# Patient Record
Sex: Female | Born: 1950 | Race: Black or African American | Hispanic: No | Smoking: Former smoker
Health system: Southern US, Community
[De-identification: ages and names within clinical notes are randomized; demographics above are authoritative.]

## PROBLEM LIST (undated history)

## (undated) DIAGNOSIS — E119 Type 2 diabetes mellitus without complications: Secondary | ICD-10-CM

## (undated) DIAGNOSIS — I10 Essential (primary) hypertension: Secondary | ICD-10-CM

## (undated) DIAGNOSIS — E785 Hyperlipidemia, unspecified: Secondary | ICD-10-CM

## (undated) HISTORY — PX: COLONOSCOPY: SHX174

## (undated) HISTORY — DX: Type 2 diabetes mellitus without complications: E11.9

## (undated) HISTORY — DX: Essential (primary) hypertension: I10

## (undated) HISTORY — DX: Hyperlipidemia, unspecified: E78.5

---

## 1999-11-10 HISTORY — PX: ABDOMINAL HYSTERECTOMY: SHX81

## 2005-11-30 ENCOUNTER — Ambulatory Visit (HOSPITAL_COMMUNITY): Admission: RE | Admit: 2005-11-30 | Discharge: 2005-11-30 | Payer: Self-pay | Admitting: Internal Medicine

## 2005-11-30 ENCOUNTER — Ambulatory Visit: Payer: Self-pay | Admitting: Internal Medicine

## 2006-03-01 ENCOUNTER — Ambulatory Visit (HOSPITAL_COMMUNITY): Admission: RE | Admit: 2006-03-01 | Discharge: 2006-03-01 | Payer: Self-pay | Admitting: Family Medicine

## 2006-11-09 DIAGNOSIS — Z9289 Personal history of other medical treatment: Secondary | ICD-10-CM

## 2007-03-07 ENCOUNTER — Ambulatory Visit (HOSPITAL_COMMUNITY): Admission: RE | Admit: 2007-03-07 | Discharge: 2007-03-07 | Payer: Self-pay | Admitting: Family Medicine

## 2008-03-09 ENCOUNTER — Ambulatory Visit (HOSPITAL_COMMUNITY): Admission: RE | Admit: 2008-03-09 | Discharge: 2008-03-09 | Payer: Self-pay | Admitting: Family Medicine

## 2008-07-26 ENCOUNTER — Ambulatory Visit (HOSPITAL_COMMUNITY): Admission: RE | Admit: 2008-07-26 | Discharge: 2008-07-26 | Payer: Self-pay | Admitting: Family Medicine

## 2009-04-03 ENCOUNTER — Ambulatory Visit (HOSPITAL_COMMUNITY): Admission: RE | Admit: 2009-04-03 | Discharge: 2009-04-03 | Payer: Self-pay | Admitting: Family Medicine

## 2010-04-17 ENCOUNTER — Ambulatory Visit (HOSPITAL_COMMUNITY): Admission: RE | Admit: 2010-04-17 | Discharge: 2010-04-17 | Payer: Self-pay | Admitting: Cardiology

## 2011-03-05 ENCOUNTER — Ambulatory Visit (INDEPENDENT_AMBULATORY_CARE_PROVIDER_SITE_OTHER): Payer: Managed Care, Other (non HMO) | Admitting: Otolaryngology

## 2011-03-05 DIAGNOSIS — R490 Dysphonia: Secondary | ICD-10-CM

## 2011-03-05 DIAGNOSIS — J310 Chronic rhinitis: Secondary | ICD-10-CM

## 2011-03-27 NOTE — Op Note (Signed)
NAME:  Megan Wyatt, Megan Wyatt                 ACCOUNT NO.:  [id number]   MEDICAL RECORD NO.:  [id number]          PATIENT TYPE:  AMB   LOCATION:  DAY                           FACILITY:  [facility]   PHYSICIAN:  R. Roetta Sessions, M.D. DATE OF BIRTH:  [birth year]   DATE OF PROCEDURE:  11/30/2005  DATE OF DISCHARGE:                                 OPERATIVE REPORT   PROCEDURE:  Screening colonoscopy.   ENDOSCOPIST:  Gerrit Friends. Rourk, M.D.   INDICATIONS FOR PROCEDURE:  Megan Wyatt is a pleasant, 60 year old  African-American female who was sent down out the courtesy of Dr. Merleen Milliner  in [place], [place] for colorectal cancer screening.  She is devoid of any  lower GI tract symptoms.  There is no family history of colorectal  neoplasia.  She has never had a colonoscopy or other imaging previously.  Colonoscopy is now being done as a screening maneuver.  This approach has  been discussed with the patient at length.  The potential risks, benefits,  and alternatives have been reviewed; questions answered.  Please see the  documentation in the medical record.   PROCEDURE NOTE:  O2 saturation, blood pressure, pulse and respirations were  monitored throughout the entire procedure.   CONSCIOUS SEDATION:  Versed 4 mg IV, Demerol 75 mg IV in divided doses.   INSTRUMENT:  Olympus video chip system.   FINDINGS:  A digital rectal exam revealed no abnormalities.   ENDOSCOPIC FINDINGS:  The prep was excellent.   RECTUM:  Examination of the rectal mucosa including the retroflex view of  the anal verge revealed no abnormalities.   COLON:  The colonic mucosa was surveyed from the rectosigmoid junction  through the left transverse and right colon to the area of the appendiceal  orifice, ileocecal valve, and cecum.  These structures were well seen and  photographed for the record.   From this level the scope was slowly withdrawn.  All previously mentioned  mucosal surfaces were again seen.  The colonic mucosa  appeared normal.  The  patient tolerated the procedure well and was reacted in endoscopy.   IMPRESSION:  1.  Normal rectum.  2.  Normal colon.   RECOMMENDATIONS:  Repeat colonoscopy in 10 years.      Jonathon Bellows, M.D.  Electronically Signed     RMR/MEDQ  D:  11/30/2005  T:  11/30/2005  Job:  [id number]   cc:   Leane Call, M.D.  [place]  [place]

## 2011-04-09 ENCOUNTER — Other Ambulatory Visit (HOSPITAL_COMMUNITY): Payer: Self-pay | Admitting: "Endocrinology

## 2011-04-09 DIAGNOSIS — Z139 Encounter for screening, unspecified: Secondary | ICD-10-CM

## 2011-04-21 ENCOUNTER — Ambulatory Visit (HOSPITAL_COMMUNITY): Payer: Managed Care, Other (non HMO)

## 2011-04-24 ENCOUNTER — Ambulatory Visit (HOSPITAL_COMMUNITY)
Admission: RE | Admit: 2011-04-24 | Discharge: 2011-04-24 | Disposition: A | Discharge status: Home / Self Care | Payer: 59 | Source: Ambulatory Visit | Attending: "Endocrinology | Admitting: "Endocrinology

## 2011-04-24 DIAGNOSIS — Z1231 Encounter for screening mammogram for malignant neoplasm of breast: Secondary | ICD-10-CM | POA: Insufficient documentation

## 2011-04-24 DIAGNOSIS — Z139 Encounter for screening, unspecified: Secondary | ICD-10-CM

## 2011-07-03 ENCOUNTER — Encounter: Payer: Self-pay | Admitting: Emergency Medicine

## 2011-07-03 ENCOUNTER — Emergency Department (HOSPITAL_COMMUNITY)
Admission: EM | Admit: 2011-07-03 | Discharge: 2011-07-03 | Disposition: A | Discharge status: Home / Self Care | Payer: Managed Care, Other (non HMO) | Attending: Emergency Medicine | Admitting: Emergency Medicine

## 2011-07-03 DIAGNOSIS — M7150 Other bursitis, not elsewhere classified, unspecified site: Secondary | ICD-10-CM

## 2011-07-03 DIAGNOSIS — M25521 Pain in right elbow: Secondary | ICD-10-CM

## 2011-07-03 DIAGNOSIS — M25529 Pain in unspecified elbow: Secondary | ICD-10-CM | POA: Insufficient documentation

## 2011-07-03 DIAGNOSIS — I10 Essential (primary) hypertension: Secondary | ICD-10-CM | POA: Insufficient documentation

## 2011-07-03 DIAGNOSIS — M25519 Pain in unspecified shoulder: Secondary | ICD-10-CM | POA: Insufficient documentation

## 2011-07-03 DIAGNOSIS — E119 Type 2 diabetes mellitus without complications: Secondary | ICD-10-CM | POA: Insufficient documentation

## 2011-07-03 DIAGNOSIS — E7800 Pure hypercholesterolemia, unspecified: Secondary | ICD-10-CM | POA: Insufficient documentation

## 2011-07-03 HISTORY — DX: Essential (primary) hypertension: I10

## 2011-07-03 MED ORDER — HYDROCODONE-ACETAMINOPHEN 5-325 MG PO TABS: 2.0000 | ORAL_TABLET | ORAL | Status: AC | PRN

## 2011-07-03 MED ORDER — PREDNISONE 10 MG PO TABS: 20.0000 mg | ORAL_TABLET | Freq: Every day | ORAL | Status: AC

## 2011-07-03 MED ORDER — HYDROCODONE-ACETAMINOPHEN 5-325 MG PO TABS
2.0000 | ORAL_TABLET | Freq: Once | ORAL | Status: AC
  Administered 2011-07-03: 2 via ORAL
  Filled 2011-07-03: qty 2

## 2011-07-03 MED ORDER — PREDNISONE 20 MG PO TABS
60.0000 mg | ORAL_TABLET | Freq: Once | ORAL | Status: AC
  Administered 2011-07-03: 60 mg via ORAL
  Filled 2011-07-03: qty 1

## 2011-07-03 NOTE — ED Notes (Signed)
Patient c/o right arm pain x 3 days.

## 2011-07-03 NOTE — ED Provider Notes (Signed)
History     CSN: [id number] Arrival date & time: 07/03/2011  1:32 AM  Chief Complaint  Patient presents with  . Arm Pain   HPI Comments: Seen 0133. Patient is formerly a resident of [place] who moved to the area several years ago and established with doctors. She is a diabetic and has at least once a year flair of bursitis to right shoulder accompanied by an elbow tendinitis. It results in pain from the elbow to the shoulder and is associated with swelling of the hand. She has been seen in the past by Dr. Merleen Milliner, orthopedist in [place] and given cortisone injections. She does not have an orthopedist here. She denies fever, chills, numbness, tingling. It is painful to attempt to raise the shoulder or bend the elbow. She has been using tramadol with no effect. She has used ice with no relief.PMD is Dr. Fransico Him.  Patient is a 60 y.o. female presenting with arm pain. The history is provided by the patient.  Arm Pain This is a new (Patient with h/o bursitis, tendinitis, arthritis here with pain to elbow, upper arm and shoulder.) problem. The current episode started more than 2 days ago. The problem occurs constantly. The problem has not changed since onset.Pertinent negatives include no chest pain, no abdominal pain, no headaches and no shortness of breath. Exacerbated by: movement. The symptoms are relieved by nothing. Treatments tried: nsaids. The treatment provided no relief.    Past Medical History  Diagnosis Date  . Diabetes mellitus   . Hypertension   . High cholesterol     Past Surgical History  Procedure Date  . Abdominal hysterectomy     No family history on file.  History  Substance Use Topics  . Smoking status: Never Smoker   . Smokeless tobacco: Not on file  . Alcohol Use: No    OB History    Grav Para Term Preterm Abortions TAB SAB Ect Mult Living                  Review of Systems  Respiratory: Negative for shortness of breath.   Cardiovascular: Negative for  chest pain.  Gastrointestinal: Negative for abdominal pain.  Musculoskeletal:       Right shoulder, elbow pain. Swelling of right hand.  Neurological: Negative for headaches.  All other systems reviewed and are negative.    Physical Exam  BP 139/73  Pulse 82  Temp(Src) 98.4 F (36.9 C) (Oral)  Resp 18  Ht 5\' 2"  (1.575 m)  Wt 178 lb (80.74 kg)  BMI 32.56 kg/m2  SpO2 99%  Physical Exam  Nursing note and vitals reviewed. Constitutional: She appears well-developed and well-nourished. She appears distressed.  HENT:  Head: Normocephalic and atraumatic.  Eyes: EOM are normal.  Neck: Normal range of motion. Neck supple.  Cardiovascular: Normal rate, normal heart sounds and intact distal pulses.   Pulmonary/Chest: Effort normal and breath sounds normal.  Musculoskeletal: She exhibits edema and tenderness.       Right Elbow ROM Decreased Unable to fully extend / Unable to flex to 90 degrees, Supracondylar region NT / tender, Radial head NT / tender, Olecrenon process NT / tender, Medial epicondyle NT / tender, Lateral epicondyle  tender, affected extremity Shoulder tender Wrist NT, Hand NT with soft tissue swelling to dorsum of hand with distal NVI CR<2secs, radial pulse intact, Sensation LT and Motor intact distally in distribution of radial, median, and ulnar nerve function.  Affected shoulder with ROM  somewhat limited  Drop test  painful but negativel, , clavicle NT / tender, A/C joint NT / , scapula NT / , proximal humerus NT, shoulder joint  tender, Motor strength  decreased at shoulder due to pain, Sensation intact LT over deltoid region.  Neurological: She has normal reflexes.  Skin: Skin is warm and dry.    ED Course  Procedures  Patient with pain to right elbow and shoulder c/w tendinitis and bursitis. Patient has a h/o same. Given analgesics, prednisone. Referral to orthopedist for follow up.Patient  understands and agrees with initial ED impression and plan with expectations  set for ED visit. Reviewed nurse notes and vital signs.     Nicoletta Dress. Colon Branch, MD 07/03/11 1610

## 2012-04-08 ENCOUNTER — Other Ambulatory Visit (HOSPITAL_COMMUNITY): Payer: Self-pay | Admitting: Family Medicine

## 2012-04-08 DIAGNOSIS — Z139 Encounter for screening, unspecified: Secondary | ICD-10-CM

## 2012-04-26 ENCOUNTER — Ambulatory Visit (HOSPITAL_COMMUNITY)
Admission: RE | Admit: 2012-04-26 | Discharge: 2012-04-26 | Disposition: A | Discharge status: Home / Self Care | Payer: Self-pay | Source: Ambulatory Visit | Attending: Family Medicine | Admitting: Family Medicine

## 2012-04-26 DIAGNOSIS — Z139 Encounter for screening, unspecified: Secondary | ICD-10-CM

## 2012-04-26 DIAGNOSIS — Z1231 Encounter for screening mammogram for malignant neoplasm of breast: Secondary | ICD-10-CM | POA: Insufficient documentation

## 2012-05-02 ENCOUNTER — Other Ambulatory Visit: Payer: Self-pay | Admitting: Family Medicine

## 2012-05-02 DIAGNOSIS — R928 Other abnormal and inconclusive findings on diagnostic imaging of breast: Secondary | ICD-10-CM

## 2012-05-18 ENCOUNTER — Ambulatory Visit (HOSPITAL_COMMUNITY)
Admission: RE | Admit: 2012-05-18 | Discharge: 2012-05-18 | Disposition: A | Discharge status: Home / Self Care | Payer: Managed Care, Other (non HMO) | Source: Ambulatory Visit | Attending: Family Medicine | Admitting: Family Medicine

## 2012-05-18 DIAGNOSIS — R928 Other abnormal and inconclusive findings on diagnostic imaging of breast: Secondary | ICD-10-CM | POA: Insufficient documentation

## 2013-08-25 ENCOUNTER — Other Ambulatory Visit: Payer: Self-pay | Admitting: Internal Medicine

## 2013-08-25 NOTE — Telephone Encounter (Signed)
LMTCB to clarify dose 

## 2013-08-29 NOTE — Telephone Encounter (Signed)
Rx was sent to pharmacy electronically. 

## 2013-09-20 ENCOUNTER — Encounter: Payer: Self-pay | Admitting: *Deleted

## 2013-09-21 ENCOUNTER — Encounter: Payer: Self-pay | Admitting: Internal Medicine

## 2013-09-21 ENCOUNTER — Ambulatory Visit: Payer: Managed Care, Other (non HMO) | Admitting: Internal Medicine

## 2013-10-03 ENCOUNTER — Other Ambulatory Visit: Payer: Self-pay | Admitting: Internal Medicine

## 2013-10-03 NOTE — Telephone Encounter (Signed)
Rx was sent to pharmacy electronically. 

## 2015-08-06 ENCOUNTER — Telehealth: Payer: Self-pay | Admitting: "Endocrinology

## 2015-08-06 NOTE — Telephone Encounter (Signed)
Left message for pt to call back. She has not seen Dr Fransico Him since 2013.

## 2015-08-06 NOTE — Telephone Encounter (Signed)
Allegra just wants you to give her a call

## 2015-09-09 ENCOUNTER — Ambulatory Visit (INDEPENDENT_AMBULATORY_CARE_PROVIDER_SITE_OTHER): Payer: PRIVATE HEALTH INSURANCE | Admitting: "Endocrinology

## 2015-09-09 ENCOUNTER — Encounter: Payer: Self-pay | Admitting: "Endocrinology

## 2015-09-09 VITALS — BP 111/71 | HR 73 | Ht 62.0 in | Wt 136.2 lb

## 2015-09-09 DIAGNOSIS — I10 Essential (primary) hypertension: Secondary | ICD-10-CM | POA: Diagnosis not present

## 2015-09-09 DIAGNOSIS — E785 Hyperlipidemia, unspecified: Secondary | ICD-10-CM | POA: Diagnosis not present

## 2015-09-09 DIAGNOSIS — E119 Type 2 diabetes mellitus without complications: Secondary | ICD-10-CM

## 2015-09-09 MED ORDER — METFORMIN HCL 1000 MG PO TABS: 500.0000 mg | ORAL_TABLET | Freq: Two times a day (BID) | ORAL | Status: AC

## 2015-09-09 NOTE — Progress Notes (Signed)
Subjective:    Patient ID: Megan Wyatt, female    DOB: 06/01/1951,    Past Medical History  Diagnosis Date  . Diabetes mellitus   . Hypertension   . Dyslipidemia   . Family history of heart disease   . History of nuclear stress test 2008    negative bruce myoview   Past Surgical History  Procedure Laterality Date  . Abdominal hysterectomy  2001  . Transthoracic echocardiogram  2008    EF=>55%; mild MR & TR   Social History   Social History  . Marital Status: Widowed    Spouse Name: N/A  . Number of Children: N/A  . Years of Education: N/A   Social History Main Topics  . Smoking status: Never Smoker   . Smokeless tobacco: Never Used  . Alcohol Use: No  . Drug Use: No  . Sexual Activity: Not Asked   Other Topics Concern  . None   Social History Narrative   Outpatient Encounter Prescriptions as of 09/09/2015  Medication Sig  . atenolol-chlorthalidone (TENORETIC) 50-25 MG per tablet Take 1 tablet by mouth daily.    . Cholecalciferol (VITAMIN D PO) Take by mouth daily.  Marland Kitchen lisinopril (PRINIVIL,ZESTRIL) 10 MG tablet Take 10 mg by mouth daily.    . metFORMIN (GLUCOPHAGE) 1000 MG tablet Take 0.5 tablets (500 mg total) by mouth 2 (two) times daily with a meal.  . niacin 100 MG tablet Take 100 mg by mouth at bedtime.  Marland Kitchen POTASSIUM GLUCONATE PO Take by mouth daily.  Marland Kitchen pyridOXINE (VITAMIN B-6) 100 MG tablet Take 100 mg by mouth daily.  . simvastatin (ZOCOR) 20 MG tablet TAKE ONE TABLET BY MOUTH AT BEDTIME -  APPOINTMENT  NEEDED  FOR  FUTURE  REFILLS  . traMADol (ULTRAM) 50 MG tablet Take 50 mg by mouth 3 (three) times daily.    . vitamin B-12 (CYANOCOBALAMIN) 1000 MCG tablet Take 1,000 mcg by mouth daily.  . [DISCONTINUED] metFORMIN (GLUCOPHAGE) 1000 MG tablet Take 1,000 mg by mouth 2 (two) times daily with a meal.  . omeprazole (PRILOSEC) 20 MG capsule Take 20 mg by mouth daily.   No facility-administered encounter medications on file as of 09/09/2015.    ALLERGIES: Allergies  Allergen Reactions  . Novocain [Procaine]    VACCINATION STATUS:  There is no immunization history on file for this patient.  Diabetes She presents for her initial diabetic visit. She has type 2 diabetes mellitus. Onset time: She was diagnosed at approximate age of 64 years. Her disease course has been improving. There are no hypoglycemic associated symptoms. Pertinent negatives for hypoglycemia include no confusion, headaches, pallor or seizures. There are no diabetic associated symptoms. Pertinent negatives for diabetes include no chest pain, no fatigue, no polydipsia, no polyphagia and no polyuria. There are no hypoglycemic complications. Symptoms are improving. There are no diabetic complications. Risk factors for coronary artery disease include diabetes mellitus and hypertension. Current diabetic treatment includes oral agent (monotherapy). She is compliant with treatment most of the time. Her weight is decreasing steadily. She is following a generally healthy diet. She has not had a previous visit with a dietitian. She rarely (She is limited due to sciatica.) participates in exercise.  Hyperlipidemia This is a chronic problem. The current episode started more than 1 year ago. Pertinent negatives include no chest pain, myalgias or shortness of breath. Current antihyperlipidemic treatment includes statins. Risk factors for coronary artery disease include hypertension.  Hypertension This is a chronic problem.  The current episode started more than 1 year ago. Pertinent negatives include no chest pain, headaches, palpitations or shortness of breath. Past treatments include ACE inhibitors.       Review of Systems  Constitutional: Negative for fatigue and unexpected weight change.  HENT: Negative for trouble swallowing and voice change.   Eyes: Negative for visual disturbance.  Respiratory: Negative for cough, shortness of breath and wheezing.   Cardiovascular:  Negative for chest pain, palpitations and leg swelling.  Gastrointestinal: Negative for nausea, vomiting and diarrhea.  Endocrine: Negative for cold intolerance, heat intolerance, polydipsia, polyphagia and polyuria.  Musculoskeletal: Negative for myalgias and arthralgias.  Skin: Negative for color change, pallor, rash and wound.  Neurological: Negative for seizures and headaches.  Psychiatric/Behavioral: Negative for suicidal ideas and confusion.    Objective:    BP 111/71 mmHg  Pulse 73  Ht  (1.575 m)  Wt 136 lb 3.2 oz (61.78 kg)  BMI 24.91 kg/m2  SpO2 99%  Wt Readings from Last 3 Encounters:  09/09/15 136 lb 3.2 oz (61.78 kg)  07/03/11 178 lb (80.74 kg)    Physical Exam  Constitutional: She is oriented to person, place, and time. She appears well-developed.  HENT:  Head: Normocephalic and atraumatic.  Eyes: EOM are normal.  Neck: Normal range of motion. Neck supple. No tracheal deviation present. No thyromegaly present.  Cardiovascular: Normal rate and regular rhythm.   Pulmonary/Chest: Effort normal and breath sounds normal.  Abdominal: Soft. Bowel sounds are normal. There is no tenderness. There is no guarding.  Musculoskeletal: Normal range of motion. She exhibits no edema.  Neurological: She is alert and oriented to person, place, and time. She has normal reflexes. No cranial nerve deficit. Coordination normal.  Skin: Skin is warm and dry. No rash noted. No erythema. No pallor.  Psychiatric: She has a normal mood and affect. Judgment normal.        Assessment & Plan:   1. Diabetes mellitus without complication (HCC)   - Patient has currently controlled matic type 2 DM since  approximately 64 years of age,  with most recent A1c of 5.9 %. Recent labs reviewed.  -complications of diabetes which include CAD, CVA, CKD, retinopathy, and neuropathy, were  all discussed in detail with the patient.  - I have counseled the patient on diet management and weight loss,  by adopting a carbohydrate restricted/protein rich diet.  - Suggestion is made for patient to avoid simple carbohydrates   from their diet including Cakes , Desserts, Ice Cream,  Soda (  diet and regular) , Sweet Tea , Candies,  Chips, Cookies, Artificial Sweeteners,   and "Sugar-free" Products . This will help patient to have stable blood glucose profile and potentially avoid unintended weight gain.  - I encouraged the patient to switch to  unprocessed or minimally processed complex starch and increased protein intake (animal or plant source), fruits, and vegetables.  - Patient is advised to stick to a routine mealtimes to eat 3 meals  a day and avoid unnecessary snacks ( to snack only to correct hypoglycemia).  - I have approached patient with the following individualized plan to manage diabetes and patient agrees:  -I will lower her metformin to 500 mg by mouth twice a day. She did have significant weight loss in recent years, all intentional, hence she will not need aggressive medication at this point.  - Patient specific target  A1c;  LDL, HDL, Triglycerides, and  Waist Circumference were discussed in detail.  2) BP/HTN: Controlled. Continue current medications . 3) Lipids/HPL:   continue statins. 4)  Weight/Diet: Lost approximately 30 pounds over 3 years. Type, exercise, and detailed carbohydrates information provided.  5) Chronic Care/Health Maintenance:  -Patient is on Statin medications and encouraged to continue to follow up with Ophthalmology, Podiatrist at least yearly or according to recommendations, and advised to   stay away from smoking. I have recommended yearly flu vaccine and pneumonia vaccination at least every 5 years; moderate intensity exercise for up to 150 minutes weekly; and  sleep for at least 7 hours a day.   Patient to bring meter and  blood glucose logs during their next visit.   I advised patient to maintain close follow up with their PCP for primary care  needs. Follow up plan: Return in about 1 year (around 09/08/2016) for diabetes, high cholesterol, high blood pressure.  Marquis LunchGebre Nida, MD Phone: [phone]  Fax: [phone]   09/09/2015, 1:28 PM

## 2015-10-29 ENCOUNTER — Telehealth: Payer: Self-pay | Admitting: Internal Medicine

## 2015-10-29 NOTE — Telephone Encounter (Signed)
RECALL FOR TCS °

## 2015-10-29 NOTE — Telephone Encounter (Signed)
Letter in the mail 

## 2016-09-09 ENCOUNTER — Ambulatory Visit: Payer: PRIVATE HEALTH INSURANCE | Admitting: "Endocrinology

## 2017-06-05 ENCOUNTER — Encounter (HOSPITAL_COMMUNITY): Payer: Self-pay | Admitting: *Deleted

## 2017-06-05 ENCOUNTER — Emergency Department (HOSPITAL_COMMUNITY)
Admission: EM | Admit: 2017-06-05 | Discharge: 2017-06-06 | Disposition: A | Discharge status: Home / Self Care | Payer: PRIVATE HEALTH INSURANCE | Attending: Emergency Medicine | Admitting: Emergency Medicine

## 2017-06-05 DIAGNOSIS — R42 Dizziness and giddiness: Secondary | ICD-10-CM | POA: Insufficient documentation

## 2017-06-05 DIAGNOSIS — E119 Type 2 diabetes mellitus without complications: Secondary | ICD-10-CM | POA: Diagnosis not present

## 2017-06-05 DIAGNOSIS — I10 Essential (primary) hypertension: Secondary | ICD-10-CM | POA: Insufficient documentation

## 2017-06-05 DIAGNOSIS — Z79899 Other long term (current) drug therapy: Secondary | ICD-10-CM | POA: Diagnosis not present

## 2017-06-05 DIAGNOSIS — Z7984 Long term (current) use of oral hypoglycemic drugs: Secondary | ICD-10-CM | POA: Insufficient documentation

## 2017-06-05 DIAGNOSIS — Z87891 Personal history of nicotine dependence: Secondary | ICD-10-CM | POA: Diagnosis not present

## 2017-06-05 LAB — CBC WITH DIFFERENTIAL/PLATELET
BASOS ABS: 0 10*3/uL (ref 0.0–0.1)
BASOS PCT: 0 %
EOS PCT: 1 %
Eosinophils Absolute: 0 10*3/uL (ref 0.0–0.7)
HCT: 37.3 % (ref 36.0–46.0)
Hemoglobin: 12.1 g/dL (ref 12.0–15.0)
Lymphocytes Relative: 17 %
Lymphs Abs: 1.1 10*3/uL (ref 0.7–4.0)
MCH: 26.4 pg (ref 26.0–34.0)
MCHC: 32.4 g/dL (ref 30.0–36.0)
MCV: 81.4 fL (ref 78.0–100.0)
MONO ABS: 0.3 10*3/uL (ref 0.1–1.0)
MONOS PCT: 5 %
Neutro Abs: 5.2 10*3/uL (ref 1.7–7.7)
Neutrophils Relative %: 77 %
PLATELETS: 283 10*3/uL (ref 150–400)
RBC: 4.58 MIL/uL (ref 3.87–5.11)
RDW: 15.7 % — AB (ref 11.5–15.5)
WBC: 6.6 10*3/uL (ref 4.0–10.5)

## 2017-06-05 LAB — BASIC METABOLIC PANEL
ANION GAP: 8 (ref 5–15)
BUN: 19 mg/dL (ref 6–20)
CALCIUM: 9.5 mg/dL (ref 8.9–10.3)
CO2: 26 mmol/L (ref 22–32)
CREATININE: 0.75 mg/dL (ref 0.44–1.00)
Chloride: 104 mmol/L (ref 101–111)
GLUCOSE: 153 mg/dL — AB (ref 65–99)
Potassium: 3.7 mmol/L (ref 3.5–5.1)
Sodium: 138 mmol/L (ref 135–145)

## 2017-06-05 LAB — CBG MONITORING, ED: GLUCOSE-CAPILLARY: 110 mg/dL — AB (ref 65–99)

## 2017-06-05 MED ORDER — SODIUM CHLORIDE 0.9 % IV SOLN
1000.0000 mL | INTRAVENOUS | Status: DC
  Administered 2017-06-05: 1000 mL via INTRAVENOUS

## 2017-06-05 MED ORDER — SODIUM CHLORIDE 0.9 % IV BOLUS (SEPSIS)
1000.0000 mL | Freq: Once | INTRAVENOUS | Status: AC
  Administered 2017-06-05: 1000 mL via INTRAVENOUS

## 2017-06-05 MED ORDER — MECLIZINE HCL 12.5 MG PO TABS
25.0000 mg | ORAL_TABLET | Freq: Once | ORAL | Status: AC
  Administered 2017-06-06: 25 mg via ORAL
  Filled 2017-06-05: qty 2

## 2017-06-05 NOTE — ED Triage Notes (Signed)
Pt reports working out side in the heat twice today. Pt reports at around 4pm she went back outside and she states that everything started spinning. Pt states that when she closes her eyes, the room starts spinning. Pt states she feels nauseated when she closes her eyes.

## 2017-06-05 NOTE — ED Provider Notes (Signed)
[facility]-EMER[facility]ENCY DEPT Provider Note   CSN: [id number] Arrival date & time: 06/05/17  2055     History   Chief Complaint Chief Complaint  Patient presents with  . Dizziness    HPI Megan Wyatt is a 66 y.o. female.   Dizziness  Quality:  Room spinning Severity:  Moderate Onset quality:  Gradual Duration:  1 day Timing:  Intermittent Progression:  Unchanged Chronicity:  New Context: head movement and standing up   Relieved by:  Nothing Worsened by:  Closing eyes and movement Ineffective treatments:  Being still Associated symptoms: no blood in stool, no chest pain, no headaches, no palpitations, no shortness of breath, no syncope and no weakness   Risk factors: no anemia, no hx of stroke and no new medications     Past Medical History:  Diagnosis Date  . Diabetes mellitus   . Dyslipidemia   . Family history of heart disease   . History of nuclear stress test 2008   negative bruce myoview  . Hypertension     Patient Active Problem List   Diagnosis Date Noted  . Diabetes mellitus without complication (HCC) 09/09/2015  . Essential hypertension, benign 09/09/2015  . Hyperlipidemia 09/09/2015    Past Surgical History:  Procedure Laterality Date  . ABDOMINAL HYSTERECTOMY  2001  . COLONOSCOPY    . TRANSTHORACIC ECHOCARDIOGRAM  2008   EF=>55%; mild MR & TR    OB History    No data available       Home Medications    Prior to Admission medications   Medication Sig Start Date End Date Taking? Authorizing Provider  atenolol-chlorthalidone (TENORETIC) 50-25 MG per tablet Take 1 tablet by mouth daily.      [provider]  Cholecalciferol (VITAMIN D PO) Take by mouth daily.    [provider]  lisinopril (PRINIVIL,ZESTRIL) 10 MG tablet Take 10 mg by mouth daily.      [provider]  metFORMIN (GLUCOPHAGE) 1000 MG tablet Take 0.5 tablets (500 mg total) by mouth 2 (two) times daily with a meal. 09/09/15   Nida, Denman GeorgeGebreselassie W, MD    niacin 100 MG tablet Take 100 mg by mouth at bedtime.    [provider]  omeprazole (PRILOSEC) 20 MG capsule Take 20 mg by mouth daily.    [provider]  POTASSIUM GLUCONATE PO Take by mouth daily.    [provider]  pyridOXINE (VITAMIN B-6) 100 MG tablet Take 100 mg by mouth daily.    [provider]  simvastatin (ZOCOR) 20 MG tablet TAKE ONE TABLET BY MOUTH AT BEDTIME -  APPOINTMENT  NEEDED  FOR  FUTURE  REFILLS 10/03/13   Hilty, Lisette AbuKenneth C, MD  traMADol (ULTRAM) 50 MG tablet Take 50 mg by mouth 3 (three) times daily.      [provider]  vitamin B-12 (CYANOCOBALAMIN) 1000 MCG tablet Take 1,000 mcg by mouth daily.    [provider]    Family History Family History  Problem Relation Age of Onset  . Heart disease Mother        CABG  . Stroke Mother   . Cancer Brother     Social History Social History  Substance Use Topics  . Smoking status: Former Games developermoker  . Smokeless tobacco: Never Used  . Alcohol use No     Allergies   Novocain [procaine]   Review of Systems Review of Systems  Constitutional: Negative for activity change.       All  ROS Neg except as noted in HPI  HENT: Negative for nosebleeds.   Eyes: Negative for photophobia and discharge.  Respiratory: Negative for cough, shortness of breath and wheezing.   Cardiovascular: Negative for chest pain, palpitations and syncope.  Gastrointestinal: Negative for abdominal pain and blood in stool.  Genitourinary: Negative for dysuria, frequency and hematuria.  Musculoskeletal: Negative for arthralgias, back pain and neck pain.  Skin: Negative.   Neurological: Positive for dizziness. Negative for seizures, speech difficulty, weakness and headaches.  Psychiatric/Behavioral: Negative for confusion and hallucinations.     Physical Exam Updated Vital Signs BP 110/63   Pulse 82   Temp 98.5 F (36.9 C) (Oral)   Resp 16   Ht 5\' 2"  (1.575 m)   Wt 73.5 kg (162 lb)    SpO2 97%   BMI 29.63 kg/m   Physical Exam  Constitutional: Vital signs are normal. She appears well-developed and well-nourished. She is active.  HENT:  Head: Normocephalic and atraumatic.  Right Ear: Tympanic membrane, external ear and ear canal normal.  Left Ear: Tympanic membrane, external ear and ear canal normal.  Nose: Nose normal.  Mouth/Throat: Uvula is midline, oropharynx is clear and moist and mucous membranes are normal.  Eyes: Pupils are equal, round, and reactive to light. Conjunctivae, EOM and lids are normal.  Fundoscopic exam:      The right eye shows no exudate, no hemorrhage and no papilledema.       The left eye shows no exudate, no hemorrhage and no papilledema.  Neck: Trachea normal, normal range of motion and phonation normal. Neck supple. Carotid bruit is not present.  Cardiovascular: Normal rate, regular rhythm and normal pulses.   Abdominal: Soft. Normal appearance and bowel sounds are normal.  Lymphadenopathy:       Head (right side): No submental, no preauricular and no posterior auricular adenopathy present.       Head (left side): No submental, no preauricular and no posterior auricular adenopathy present.    She has no cervical adenopathy.  Neurological: She is alert. She has normal strength. No cranial nerve deficit or sensory deficit. Coordination normal. GCS eye subscore is 4. GCS verbal subscore is 5. GCS motor subscore is 6.  Skin: Skin is warm and dry.  Psychiatric: Her speech is normal.     ED Treatments / Results  Labs (all labs ordered are listed, but only abnormal results are displayed) Labs Reviewed  CBG MONITORING, ED - Abnormal; Notable for the following:       Result Value   Glucose-Capillary 110 (*)    All other components within normal limits  CBC WITH DIFFERENTIAL/PLATELET  BASIC METABOLIC PANEL  URINALYSIS, ROUTINE W REFLEX MICROSCOPIC    EKG  EKG Interpretation None       Radiology No results  found.  Procedures Procedures (including critical care time)  Medications Ordered in ED Medications  sodium chloride 0.9 % bolus 1,000 mL (not administered)    Followed by  0.9 %  sodium chloride infusion (not administered)     Initial Impression / Assessment and Plan / ED Course  I have reviewed the triage vital signs and the nursing notes.  Pertinent labs & imaging results that were available during my care of the patient were reviewed by me and considered in my medical decision making (see chart for details).       Final Clinical Impressions(s) / ED Diagnoses MDM Vital signs reviewed. EKG non-acute. Ua negative for acute problem. CBC and Bmet neg  for acute problem. Pt treated with meclizine with some improvement. Pt advised to increase fluids. Change positions slowly. Use meclizine tid. Pt to follow up with PCP or return to the ED if not improving.   Final diagnoses:  None    New Prescriptions Discharge Medication List as of 06/06/2017  1:00 AM    START taking these medications   Details  meclizine (ANTIVERT) 25 MG tablet Take 1 tablet (25 mg total) by mouth 3 (three) times daily as needed for dizziness., Starting Sun 06/06/2017, Print    promethazine (PHENERGAN) 12.5 MG tablet Take 1 tablet (12.5 mg total) by mouth every 6 (six) hours as needed for nausea or vomiting. FOR NAUSEA., Starting Sun 06/06/2017, Print         Ivery Quale, PA-C 06/07/17 1600    Bethann Berkshire, MD 06/07/17 [phone]

## 2017-06-05 NOTE — ED Notes (Signed)
Pt reports feeling as though the room is spinning when she closes her eyes only. States she drank sufficient amount of water and Gatorade while doing yard work today. Denies LOC.

## 2017-06-05 NOTE — ED Notes (Signed)
Pt reports she was here on Weds and remarks if this is the same room she was in then  Call to registration to clarify  Pt ambulates erect, in a straight without stagger or drift  Her gait is very brisk

## 2017-06-06 LAB — URINALYSIS, ROUTINE W REFLEX MICROSCOPIC
Bilirubin Urine: NEGATIVE
GLUCOSE, UA: NEGATIVE mg/dL
HGB URINE DIPSTICK: NEGATIVE
KETONES UR: NEGATIVE mg/dL
Leukocytes, UA: NEGATIVE
Nitrite: NEGATIVE
PROTEIN: NEGATIVE mg/dL
Specific Gravity, Urine: 1.008 (ref 1.005–1.030)
pH: 6 (ref 5.0–8.0)

## 2017-06-06 MED ORDER — PROMETHAZINE HCL 12.5 MG PO TABS: 12.5000 mg | ORAL_TABLET | Freq: Four times a day (QID) | ORAL | 0 refills | Status: AC | PRN

## 2017-06-06 MED ORDER — MECLIZINE HCL 25 MG PO TABS: 25.0000 mg | ORAL_TABLET | Freq: Three times a day (TID) | ORAL | 0 refills | Status: AC | PRN

## 2017-06-06 NOTE — ED Notes (Signed)
Assisted patient to restroom.

## 2017-06-06 NOTE — Discharge Instructions (Signed)
Your vital signs within normal limits. Your electrocardiogram is negative for acute changes. Your urine tests, and chemistries are negative for acute event. Your examination suggest vertigo. Please use caution changing positions. Please rest is much as possible. Use Antivert 3 times daily. May use promethazine for nausea/vomiting if needed. Both of these medications may cause drowsiness. Please do not drive, operate machinery, or participated in activities requiring concentration when taking either these medications. Please return to the emergency department if not improving.

## 2017-11-08 DIAGNOSIS — I10 Essential (primary) hypertension: Secondary | ICD-10-CM | POA: Insufficient documentation

## 2017-11-08 DIAGNOSIS — E785 Hyperlipidemia, unspecified: Secondary | ICD-10-CM | POA: Insufficient documentation

## 2017-11-08 DIAGNOSIS — Z8249 Family history of ischemic heart disease and other diseases of the circulatory system: Secondary | ICD-10-CM | POA: Insufficient documentation

## 2017-11-19 ENCOUNTER — Ambulatory Visit: Payer: Managed Care, Other (non HMO) | Admitting: Internal Medicine

## 2017-12-15 ENCOUNTER — Encounter: Payer: Self-pay | Admitting: *Deleted

## 2017-12-15 ENCOUNTER — Encounter: Payer: Self-pay | Admitting: Cardiology

## 2017-12-15 NOTE — Progress Notes (Deleted)
Cardiology Office Note  Date: 12/15/2017   ID: Megan Wyatt, DOB 1951-08-15, MRN [id number]  PCP: System, Pcp Not In  Consulting Cardiologist: Nona Dell, MD   No chief complaint on file.   History of Present Illness: Megan Wyatt is a 67 y.o. female   Limited records indicate previous cardiac testing through [facility] back in 2008.  Past Medical History:  Diagnosis Date  . Hyperlipidemia   . Hypertension   . Type 2 diabetes mellitus (HCC)     Past Surgical History:  Procedure Laterality Date  . ABDOMINAL HYSTERECTOMY  2001  . COLONOSCOPY    . TRANSTHORACIC ECHOCARDIOGRAM  2008   EF=>55%; mild MR & TR    Current Outpatient Medications  Medication Sig Dispense Refill  . atenolol-chlorthalidone (TENORETIC) 50-25 MG per tablet Take 1 tablet by mouth daily.      . Cholecalciferol (VITAMIN D PO) Take by mouth daily.    Marland Kitchen lisinopril (PRINIVIL,ZESTRIL) 10 MG tablet Take 10 mg by mouth daily.      . meclizine (ANTIVERT) 25 MG tablet Take 1 tablet (25 mg total) by mouth 3 (three) times daily as needed for dizziness. 21 tablet 0  . metFORMIN (GLUCOPHAGE) 1000 MG tablet Take 0.5 tablets (500 mg total) by mouth 2 (two) times daily with a meal. 180 tablet 4  . niacin 100 MG tablet Take 100 mg by mouth at bedtime.    Marland Kitchen omeprazole (PRILOSEC) 20 MG capsule Take 20 mg by mouth daily.    Marland Kitchen POTASSIUM GLUCONATE PO Take by mouth daily.    . promethazine (PHENERGAN) 12.5 MG tablet Take 1 tablet (12.5 mg total) by mouth every 6 (six) hours as needed for nausea or vomiting. FOR NAUSEA. 10 tablet 0  . pyridOXINE (VITAMIN B-6) 100 MG tablet Take 100 mg by mouth daily.    . simvastatin (ZOCOR) 20 MG tablet TAKE ONE TABLET BY MOUTH AT BEDTIME -  APPOINTMENT  NEEDED  FOR  FUTURE  REFILLS 15 tablet 0  . traMADol (ULTRAM) 50 MG tablet Take 50 mg by mouth 3 (three) times daily.      . vitamin B-12 (CYANOCOBALAMIN) 1000 MCG tablet Take 1,000 mcg by mouth daily.     No current facility-administered  medications for this visit.    Allergies:  Novocain [procaine]   Social History: The patient  reports that she has quit smoking. she has never used smokeless tobacco. She reports that she does not drink alcohol or use drugs.   Family History: The patient's family history includes Cancer in her brother; Heart disease in her mother; Stroke in her mother.   ROS:  Please see the history of present illness. Otherwise, complete review of systems is positive for {NONE DEFAULTED:18576::"none"}.  All other systems are reviewed and negative.   Physical Exam: VS:  There were no vitals taken for this visit., BMI There is no height or weight on file to calculate BMI.  Wt Readings from Last 3 Encounters:  06/05/17 162 lb (73.5 kg)  09/09/15 136 lb 3.2 oz (61.8 kg)  07/03/11 178 lb (80.7 kg)    General: Patient appears comfortable at rest. HEENT: Conjunctiva and lids normal, oropharynx clear with moist mucosa. Neck: Supple, no elevated JVP or carotid bruits, no thyromegaly. Lungs: Clear to auscultation, nonlabored breathing at rest. Cardiac: Regular rate and rhythm, no S3 or significant systolic murmur, no pericardial rub. Abdomen: Soft, nontender, no hepatomegaly, bowel sounds present, no guarding or rebound. Extremities: No pitting edema, distal  pulses 2+. Skin: Warm and dry. Musculoskeletal: No kyphosis. Neuropsychiatric: Alert and oriented x3, affect grossly appropriate.  ECG: I personally reviewed the tracing from 10/06/2017 which showed sinus rhythm with PVC.  Recent Labwork: 06/05/2017: BUN 19; Creatinine, Ser 0.75; Hemoglobin 12.1; Platelets 283; Potassium 3.7; Sodium 138   Other Studies Reviewed Today:  Exercise Myoview 06/27/2007 ([facility]); No diagnostic ST segment changes at maximum workload of 10 metastases, normal myocardial perfusion with LVEF 73%.  Assessment and Plan:   Current medicines were reviewed with the patient today.  No orders of the defined types were placed in this  encounter.   Disposition:  Signed, Jonelle SidleSamuel G. Danilo Cappiello, MD, [facility] 12/15/2017 10:43 AM    [facility] [address], [place], [place] [address] Phone: [phone]) [phone]; Fax: [phone]

## 2017-12-16 ENCOUNTER — Ambulatory Visit: Payer: PRIVATE HEALTH INSURANCE | Admitting: Cardiology

## 2018-02-14 ENCOUNTER — Encounter: Payer: Self-pay | Admitting: Cardiology

## 2018-02-14 NOTE — Progress Notes (Signed)
Cardiology Office Note  Date: 02/16/2018   ID: Megan Wyatt, DOB 09/30/1951, MRN [id number]  PCP: Delorse Lek, FNP  Consulting Cardiologist: Nona Dell, MD   Chief Complaint  Patient presents with  . Cardiac follow-up    History of Present Illness: Megan Wyatt is a 67 y.o. female referred for cardiology consultation by Dr. Mayford Knife for follow-up cardiac evaluation.  Unfortunately, I do not have any of her previous records.  She tells me that she followed with Dr. Rennis Golden several years ago when he was seeing patients in [facility] here in [place].  Based on talking with her, she does not have any history of obstructive CAD or myocardial infarction, rather she was being followed for risk factor reduction due to the fact that her mother has heart disease.  I did find results of a previous exercise Myoview in August 2008 showing no diagnostic ST segment changes at maximum workload of 10 METS, LVEF 73%, and low risk myocardial perfusion imaging without evidence of ischemia.  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  She does not report any exertional chest pain or increasing shortness of breath.  She states that she has been compliant with her medications.  She states that she is retired from [employer].  She had an ECG done within the last year as outlined below.  Current medications include aspirin, Tenoretic, lisinopril, and Zocor.  She states that her diabetes has been well controlled by PCP.  Past Medical History:  Diagnosis Date  . Hyperlipidemia   . Hypertension   . Type 2 diabetes mellitus (HCC)     Past Surgical History:  Procedure Laterality Date  . ABDOMINAL HYSTERECTOMY  2001  . COLONOSCOPY      Current Outpatient Medications  Medication Sig Dispense Refill  . amoxicillin-clavulanate (AUGMENTIN) 875-125 MG tablet Take 1 tablet by mouth every 12 (twelve) hours.  0  .  aspirin EC 81 MG tablet Take 81 mg by mouth daily.    Marland Kitchen atenolol-chlorthalidone (TENORETIC) 50-25 MG per tablet Take 1 tablet by mouth daily.      . Cholecalciferol (VITAMIN D PO) Take by mouth daily.    . choline & magnesium salicylate (TRISAL) 500 MG/5ML LIQD Take 500 mg by mouth daily.    . cyclobenzaprine (FLEXERIL) 5 MG tablet Take 5 mg by mouth 2 (two) times daily as needed.  2  . diclofenac (VOLTAREN) 75 MG EC tablet Take 75 mg by mouth 2 (two) times daily.     . Garlic 1000 MG CAPS Take by mouth.    Marland Kitchen lisinopril (PRINIVIL,ZESTRIL) 10 MG tablet Take 10 mg by mouth daily.      . meclizine (ANTIVERT) 25 MG tablet Take 1 tablet (25 mg total) by mouth 3 (three) times daily as needed for dizziness. 21 tablet 0  . metFORMIN (GLUCOPHAGE) 1000 MG tablet Take 0.5 tablets (500 mg total) by mouth 2 (two) times daily with a meal. 180 tablet 4  . niacin 100 MG tablet Take 100 mg by mouth at bedtime.    Marland Kitchen omeprazole (PRILOSEC) 20 MG capsule Take 20 mg by mouth daily.    Marland Kitchen POTASSIUM GLUCONATE PO Take by mouth daily.    . promethazine (PHENERGAN) 12.5 MG tablet Take 1 tablet (12.5 mg total) by mouth every 6 (six) hours as needed for nausea or vomiting. FOR NAUSEA. 10 tablet 0  . pyridOXINE (  VITAMIN B-6) 100 MG tablet Take 100 mg by mouth daily.    . simvastatin (ZOCOR) 20 MG tablet TAKE ONE TABLET BY MOUTH AT BEDTIME -  APPOINTMENT  NEEDED  FOR  FUTURE  REFILLS 15 tablet 0  . traMADol (ULTRAM) 50 MG tablet Take 50 mg by mouth 3 (three) times daily.      . vitamin B-12 (CYANOCOBALAMIN) 1000 MCG tablet Take 1,000 mcg by mouth daily.     No current facility-administered medications for this visit.    Allergies:  Novocain [procaine]   Social History: The patient  reports that she has quit smoking. Her smoking use included cigarettes. She has never used smokeless tobacco. She reports that she does not drink alcohol or use drugs.   Family History: The patient's family history includes Cancer in her  brother; Heart disease in her mother; Stroke in her mother.   ROS:  Please see the history of present illness. Otherwise, complete review of systems is positive for active sinusitis for which she is currently taking antibiotics.  All other systems are reviewed and negative.   Physical Exam: VS:  BP 126/74 (BP Location: Left Arm)   Pulse 72   Ht 5\' 2"  (1.575 m)   Wt 155 lb (70.3 kg)   SpO2 97%   BMI 28.35 kg/m , BMI Body mass index is 28.35 kg/m.  Wt Readings from Last 3 Encounters:  02/16/18 155 lb (70.3 kg)  06/05/17 162 lb (73.5 kg)  09/09/15 136 lb 3.2 oz (61.8 kg)    General: Patient wearing respiratory mask, no distress. HEENT: Conjunctiva and lids normal, oropharynx clear. Neck: Supple, no elevated JVP or carotid bruits, no thyromegaly. Lungs: Clear to auscultation, nonlabored breathing at rest. Cardiac: Regular rate and rhythm, no S3 or significant systolic murmur, no pericardial rub. Abdomen: Soft, nontender, bowel sounds present. Extremities: No pitting edema, distal pulses 2+. Skin: Warm and dry. Musculoskeletal: No kyphosis. Neuropsychiatric: Alert and oriented x3, affect grossly appropriate.  ECG: I personally reviewed the tracing from 06/07/2017 which showed sinus rhythm with PVC.  Recent Labwork: 06/05/2017: BUN 19; Creatinine, Ser 0.75; Hemoglobin 12.1; Platelets 283; Potassium 3.7; Sodium 138   Assessment and Plan:  1.  Follow-up for presumed cardiac risk assessment.  At this point I do not have any detailed records, these are being requested regarding previous workup with Dr. Rennis GoldenHilty.  Based on our discussion today it does not sound like she had any previously documented history of obstructive CAD or myocardial infarction.  At the present time she is on antiplatelet regimen and statin and she states that her diabetes has been well controlled.  I plan to request follow-up lab work from her PCP.  When more information is available, we can determine if any follow-up  ischemic testing is warranted.  Not report any angina at this time.  2.  Type 2 diabetes mellitus, on Glucophage and followed by PCP.  3.  Mixed hyperlipidemia, on Zocor.  4.  Essential hypertension, currently on Tenoretic and lisinopril.  Blood pressure is well controlled today.  Current medicines were reviewed with the patient today.   Orders Placed This Encounter  Procedures  . EKG 12-Lead    Disposition: Tentative follow-up in 1 year.   Signed, Jonelle SidleSamuel G. McDowell, MD, [facility] 02/16/2018 2:13 PM    [facility] at [facility] [address], [place], [place] [address] Phone: [phone]) [phone]; Fax: [phone]

## 2018-02-16 ENCOUNTER — Encounter: Payer: Self-pay | Admitting: Cardiology

## 2018-02-16 ENCOUNTER — Ambulatory Visit: Payer: Medicare PPO | Admitting: Cardiology

## 2018-02-16 VITALS — BP 126/74 | HR 72 | Ht 62.0 in | Wt 155.0 lb

## 2018-02-16 DIAGNOSIS — E782 Mixed hyperlipidemia: Secondary | ICD-10-CM | POA: Diagnosis not present

## 2018-02-16 DIAGNOSIS — E118 Type 2 diabetes mellitus with unspecified complications: Secondary | ICD-10-CM | POA: Diagnosis not present

## 2018-02-16 DIAGNOSIS — Z9189 Other specified personal risk factors, not elsewhere classified: Secondary | ICD-10-CM | POA: Diagnosis not present

## 2018-02-16 DIAGNOSIS — I10 Essential (primary) hypertension: Secondary | ICD-10-CM

## 2018-02-16 NOTE — Patient Instructions (Signed)
Medication Instructions:  Your physician recommends that you continue on your current medications as directed. Please refer to the Current Medication list given to you today.   Labwork: NONE  Testing/Procedures: NONE  Follow-Up: Your physician wants you to follow-up in: 1 YEAR.  You will receive a reminder letter in the mail two months in advance. If you don't receive a letter, please call our office to schedule the follow-up appointment.   Any Other Special Instructions Will Be Listed Below (If Applicable). WE WILL REQUEST RECORDS FROM PCP & DR. HILTY SO DR. MCDOWELL CAN REVIEW     If you need a refill on your cardiac medications before your next appointment, please call your pharmacy.

## 2018-02-28 NOTE — Progress Notes (Signed)
[facility]  Office visit from July 2013 with Dr. Rennis GoldenHilty reviewed.  She had no documented history of CAD or myocardial infarction at that time, was being followed for risk factor modification in general with history of CAD in her mother.    ECG from May 17, 2012 showed normal sinus rhythm with low voltage in the lateral precordial leads.    Lipid panel from July 2013 showed cholesterol 125, HDL 54, triglycerides 69, and LDL 57.  Echocardiogram from 06/27/2007 reported LVEF greater than or equal to 55% with normal LV wall thickness and normal diastolic function, normal right ventricular contraction, normal left and right atrial chamber size, mild mitral regurgitation without prolapse, mild tricuspid regurgitation, normal aortic valve, trace pulmonic regurgitation, no pericardial effusion.

## 2019-02-20 ENCOUNTER — Ambulatory Visit: Payer: PRIVATE HEALTH INSURANCE | Admitting: Cardiology

## 2019-06-06 NOTE — Progress Notes (Signed)
Cardiology Office Note  Date: 06/07/2019   ID: Megan Wyatt, DOB 10/08/1951, MRN [id number]  PCP:  Delorse LekBlair, Diane W, FNP  Cardiologist:  Nona DellSamuel Rehmat Murtagh, MD Electrophysiologist:  None   Chief Complaint  Patient presents with  . Cardiac follow-up    History of Present Illness: Megan Wyatt is a 68 y.o. female last seen in April 2019.  She presents today for a routine follow-up visit.  She states that she remains very busy, functional with all ADLs and also taking care of 2 separate homes/yards.  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  She does not report any exertional chest pain or unusual breathlessness. She states that she has been taking her medications regularly, outlined below.  She had lab work per PCP a few months back which we are requesting for review.  I personally reviewed her ECG today which shows normal sinus rhythm.  Past Medical History:  Diagnosis Date  . Hyperlipidemia   . Hypertension   . Type 2 diabetes mellitus (HCC)     Past Surgical History:  Procedure Laterality Date  . ABDOMINAL HYSTERECTOMY  2001  . COLONOSCOPY      Current Outpatient Medications  Medication Sig Dispense Refill  . aspirin EC 81 MG tablet Take 81 mg by mouth daily.    Marland Kitchen. atenolol-chlorthalidone (TENORETIC) 50-25 MG per tablet Take 1 tablet by mouth daily.      . Cholecalciferol (VITAMIN D PO) Take by mouth daily.    . choline & magnesium salicylate (TRISAL) 500 MG/5ML LIQD Take 500 mg by mouth daily.    . cyclobenzaprine (FLEXERIL) 5 MG tablet Take 5 mg by mouth 2 (two) times daily as needed.  2  . diclofenac (VOLTAREN) 75 MG EC tablet Take 75 mg by mouth 2 (two) times daily.     . Garlic 1000 MG CAPS Take by mouth.    Marland Kitchen. lisinopril (PRINIVIL,ZESTRIL) 10 MG tablet Take 10 mg by mouth daily.      . meclizine (ANTIVERT) 25 MG tablet Take 1 tablet (25 mg total) by mouth 3 (three) times daily as needed for dizziness. 21 tablet 0  . metFORMIN (GLUCOPHAGE) 1000 MG tablet Take 0.5  tablets (500 mg total) by mouth 2 (two) times daily with a meal. 180 tablet 4  . niacin 100 MG tablet Take 100 mg by mouth at bedtime.    Marland Kitchen. omeprazole (PRILOSEC) 20 MG capsule Take 20 mg by mouth daily.    Marland Kitchen. POTASSIUM GLUCONATE PO Take by mouth daily.    . promethazine (PHENERGAN) 12.5 MG tablet Take 1 tablet (12.5 mg total) by mouth every 6 (six) hours as needed for nausea or vomiting. FOR NAUSEA. 10 tablet 0  . pyridOXINE (VITAMIN B-6) 100 MG tablet Take 100 mg by mouth daily.    . simvastatin (ZOCOR) 20 MG tablet TAKE ONE TABLET BY MOUTH AT BEDTIME -  APPOINTMENT  NEEDED  FOR  FUTURE  REFILLS 15 tablet 0  . traMADol (ULTRAM) 50 MG tablet Take 50 mg by mouth 3 (three) times daily.      . vitamin B-12 (CYANOCOBALAMIN) 1000 MCG tablet Take 1,000 mcg by mouth daily.     No current facility-administered medications for this visit.    Allergies:  Novocain [procaine]   Social History: The patient  reports that she has quit smoking. Her smoking use included cigarettes. She has never used smokeless tobacco. She reports that she does not drink alcohol or use drugs.   ROS:  Please see the history of present illness. Otherwise, complete review of systems is positive for none.  All other systems are reviewed and negative.   Physical Exam: VS:  BP 139/86   Pulse 67   Temp (!) 96.6 F (35.9 C)   Ht 5\' 2"  (1.575 m)   Wt 180 lb (81.6 kg)   SpO2 97%   BMI 32.92 kg/m , BMI Body mass index is 32.92 kg/m.  Wt Readings from Last 3 Encounters:  06/07/19 180 lb (81.6 kg)  02/16/18 155 lb (70.3 kg)  06/05/17 162 lb (73.5 kg)    General: Patient appears comfortable at rest. HEENT: Conjunctiva and lids normal, wearing a mask. Neck: Supple, no elevated JVP or carotid bruits, no thyromegaly. Lungs: Clear to auscultation, nonlabored breathing at rest. Cardiac: Regular rate and rhythm, no S3 or significant systolic murmur. Abdomen: Soft, nontender, bowel sounds present. Extremities: No pitting edema,  distal pulses 2+. Skin: Warm and dry. Musculoskeletal: No kyphosis. Neuropsychiatric: Alert and oriented x3, affect grossly appropriate.  ECG:  An ECG dated 02/16/2018 was personally reviewed today and demonstrated:  Sinus rhythm with nondiagnostic Q wave in lead III.  Recent Labwork:  February 2019: AST 28, ALT 32, cholesterol 132, HDL 60, triglycerides 107, LDL 54, potassium 4.3, BUN 17, creatinine 1.1, hemoglobin 13.6  Other Studies Reviewed Today:  Echocardiogram 06/27/2007: LVEF greater than or equal to 55% with normal LV wall thickness and normal diastolic function, normal right ventricular contraction, normal left and right atrial chamber size, mild mitral regurgitation without prolapse, mild tricuspid regurgitation, normal aortic valve, trace pulmonic regurgitation, no pericardial effusion.  Exercise Myoview August 2008: No diagnostic ST segment changes at maximum workload of 10 METS, LVEF 73%, and low risk myocardial perfusion imaging without evidence of ischemia.  Assessment and Plan:  1.  Follow-up visit for a 68 year old woman with history of hypertension, type 2 diabetes mellitus, and mixed hyperlipidemia.  She does not report any exertional chest pain or breathlessness, ECG reviewed and stable.  She reports good exercise tolerance and no change in stamina.  She is on aspirin and statin therapy with treatment for diabetes per PCP as well.  In the absence of new symptoms we will continue with observation.  2.  Mixed hyperlipidemia on Zocor.  Requesting interval lab work from PCP.  Last LDL was 54.  3.  Essential hypertension, systolic is in the 585I today.  Medication Adjustments/Labs and Tests Ordered: Current medicines are reviewed at length with the patient today.  Concerns regarding medicines are outlined above.   Tests Ordered: Orders Placed This Encounter  Procedures  . EKG 12-Lead    Medication Changes: No orders of the defined types were placed in this  encounter.   Disposition:  Follow up 1 year in the [place] office.  Signed, Satira Sark, MD, [facility] 06/07/2019 12:54 PM    [place] at [facility] [address] [place],  [address] Phone: [phone]; Fax: [phone]

## 2019-06-07 ENCOUNTER — Encounter (INDEPENDENT_AMBULATORY_CARE_PROVIDER_SITE_OTHER): Payer: Self-pay

## 2019-06-07 ENCOUNTER — Ambulatory Visit (INDEPENDENT_AMBULATORY_CARE_PROVIDER_SITE_OTHER): Payer: Medicare HMO | Admitting: Cardiology

## 2019-06-07 ENCOUNTER — Other Ambulatory Visit: Payer: Self-pay

## 2019-06-07 ENCOUNTER — Encounter: Payer: Self-pay | Admitting: Cardiology

## 2019-06-07 VITALS — BP 139/86 | HR 67 | Temp 96.6°F | Ht 62.0 in | Wt 180.0 lb

## 2019-06-07 DIAGNOSIS — I10 Essential (primary) hypertension: Secondary | ICD-10-CM

## 2019-06-07 DIAGNOSIS — E782 Mixed hyperlipidemia: Secondary | ICD-10-CM | POA: Diagnosis not present

## 2019-06-07 DIAGNOSIS — Z9189 Other specified personal risk factors, not elsewhere classified: Secondary | ICD-10-CM

## 2019-06-07 NOTE — Patient Instructions (Addendum)
Medication Instructions:  Your physician recommends that you continue on your current medications as directed. Please refer to the Current Medication list given to you today.  If you need a refill on your cardiac medications before your next appointment, please call your pharmacy.   Lab work: NONE  If you have labs (blood work) drawn today and your tests are completely normal, you will receive your results only by: . MyChart Message (if you have MyChart) OR . A paper copy in the mail If you have any lab test that is abnormal or we need to change your treatment, we will call you to review the results.  Testing/Procedures: NONE   Follow-Up: At [facility], you and your health needs are our priority.  As part of our continuing mission to provide you with exceptional heart care, we have created designated Provider Care Teams.  These Care Teams include your primary Cardiologist (physician) and Advanced Practice Providers (APPs -  Physician Assistants and Nurse Practitioners) who all work together to provide you with the care you need, when you need it. You will need a follow up appointment in 1 years.  Please call our office 2 months in advance to schedule this appointment.  You may see Samuel McDowell, MD or one of the following Advanced Practice Providers on your designated Care Team:   Brittany Strader, PA-C ([place] Office) . Michele Lenze, PA-C ([place] Office)  Any Other Special Instructions Will Be Listed Below (If Applicable). Thank you for choosing [facility]!     

## 2019-07-25 ENCOUNTER — Encounter: Payer: Self-pay | Admitting: Family Medicine

## 2020-05-07 ENCOUNTER — Telehealth: Payer: Self-pay

## 2020-05-07 NOTE — Telephone Encounter (Signed)
  Patient Consent for Virtual Visit         Megan Wyatt has provided verbal consent on 05/07/2020 for a virtual visit (video or telephone).   CONSENT FOR VIRTUAL VISIT FOR:  Megan Wyatt  By participating in this virtual visit I agree to the following:  I hereby voluntarily request, consent and authorize [facility] and its employed or contracted physicians, physician assistants, nurse practitioners or other licensed health care professionals (the Practitioner), to provide me with telemedicine health care services (the "Services") as deemed necessary by the treating Practitioner. I acknowledge and consent to receive the Services by the Practitioner via telemedicine. I understand that the telemedicine visit will involve communicating with the Practitioner through live audiovisual communication technology and the disclosure of certain medical information by electronic transmission. I acknowledge that I have been given the opportunity to request an in-person assessment or other available alternative prior to the telemedicine visit and am voluntarily participating in the telemedicine visit.  I understand that I have the right to withhold or withdraw my consent to the use of telemedicine in the course of my care at any time, without affecting my right to future care or treatment, and that the Practitioner or I may terminate the telemedicine visit at any time. I understand that I have the right to inspect all information obtained and/or recorded in the course of the telemedicine visit and may receive copies of available information for a reasonable fee.  I understand that some of the potential risks of receiving the Services via telemedicine include:  Marland Kitchen Delay or interruption in medical evaluation due to technological equipment failure or disruption; . Information transmitted may not be sufficient (e.g. poor resolution of images) to allow for appropriate medical decision making by the Practitioner;  and/or  . In rare instances, security protocols could fail, causing a breach of personal health information.  Furthermore, I acknowledge that it is my responsibility to provide information about my medical history, conditions and care that is complete and accurate to the best of my ability. I acknowledge that Practitioner's advice, recommendations, and/or decision may be based on factors not within their control, such as incomplete or inaccurate data provided by me or distortions of diagnostic images or specimens that may result from electronic transmissions. I understand that the practice of medicine is not an exact science and that Practitioner makes no warranties or guarantees regarding treatment outcomes. I acknowledge that a copy of this consent can be made available to me via my patient portal [facility] MyChart), or I can request a printed copy by calling the office of [facility].    I understand that my insurance will be billed for this visit.   I have read or had this consent read to me. . I understand the contents of this consent, which adequately explains the benefits and risks of the Services being provided via telemedicine.  . I have been provided ample opportunity to ask questions regarding this consent and the Services and have had my questions answered to my satisfaction. . I give my informed consent for the services to be provided through the use of telemedicine in my medical care

## 2020-05-08 ENCOUNTER — Encounter: Payer: Self-pay | Admitting: Cardiology

## 2020-05-08 ENCOUNTER — Other Ambulatory Visit: Payer: Self-pay

## 2020-05-08 ENCOUNTER — Telehealth (INDEPENDENT_AMBULATORY_CARE_PROVIDER_SITE_OTHER): Payer: Medicare PPO | Admitting: Cardiology

## 2020-05-08 ENCOUNTER — Encounter: Payer: Self-pay | Admitting: *Deleted

## 2020-05-08 VITALS — Ht 62.0 in | Wt 162.0 lb

## 2020-05-08 DIAGNOSIS — E782 Mixed hyperlipidemia: Secondary | ICD-10-CM | POA: Diagnosis not present

## 2020-05-08 DIAGNOSIS — I10 Essential (primary) hypertension: Secondary | ICD-10-CM | POA: Diagnosis not present

## 2020-05-08 NOTE — Progress Notes (Signed)
Virtual Visit via Telephone Note   This visit type was conducted due to national recommendations for restrictions regarding the COVID-19 Pandemic (e.g. social distancing) in an effort to limit this patient's exposure and mitigate transmission in our community.  Due to her co-morbid illnesses, this patient is at least at moderate risk for complications without adequate follow up.  This format is felt to be most appropriate for this patient at this time.  The patient did not have access to video technology/had technical difficulties with video requiring transitioning to audio format only (telephone).  All issues noted in this document were discussed and addressed.  No physical exam could be performed with this format.  Please refer to the patient's chart for her  consent to telehealth for [facility].   The patient was identified using 2 identifiers.  Date:  05/08/2020   ID:  Megan Wyatt, DOB 1951-10-03, MRN [id number]  Patient Location: Home Provider Location: Office  PCP:  Delorse Lek, FNP  Cardiologist:  Nona Dell, MD Electrophysiologist:  None   Evaluation Performed:  Follow-Up Visit  Chief Complaint:   Cardiac follow-up  History of Present Illness:    Megan Wyatt is a 70 y.o. female last seen in July 2020.  We spoke by phone today.  She states that she has been doing well, no exertional chest pain or unusual shortness of breath.  She walks on a treadmill for exercise.  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  She reports having recent follow-up with her PCP and lab work which we are requesting.  She has generally had well-controlled lipids over time, as of September 2020 her LDL was sixty.  Hemoglobin A1c was 6.6% at that time.  I reviewed her medications which are stable and outlined below.  Past Medical History:  Diagnosis Date  . Essential hypertension   . Hyperlipidemia   . Type 2 diabetes mellitus (HCC)    Past Surgical History:  Procedure  Laterality Date  . ABDOMINAL HYSTERECTOMY  2001  . COLONOSCOPY       Current Meds  Medication Sig  . ascorbic acid (VITAMIN C) 500 MG tablet Take 500 mg by mouth daily.  Marland Kitchen aspirin EC 81 MG tablet Take 81 mg by mouth daily.  Marland Kitchen atenolol-chlorthalidone (TENORETIC) 50-25 MG per tablet Take 1 tablet by mouth daily.    Marland Kitchen b complex vitamins tablet Take 1 tablet by mouth daily.  . Cholecalciferol (VITAMIN D PO) Take by mouth daily.  . Cholecalciferol (VITAMIN D3) 125 MCG (5000 UT) CAPS Take 1 capsule by mouth daily.  . cyclobenzaprine (FLEXERIL) 5 MG tablet Take 5 mg by mouth 2 (two) times daily as needed.  . diclofenac (VOLTAREN) 75 MG EC tablet Take 75 mg by mouth 2 (two) times daily as needed.   . Garlic 1000 MG CAPS Take 1 capsule by mouth daily.   Marland Kitchen lisinopril (PRINIVIL,ZESTRIL) 10 MG tablet Take 10 mg by mouth daily.    . magnesium gluconate (MAGONATE) 500 MG tablet Take 500 mg by mouth at bedtime as needed.  . meclizine (ANTIVERT) 25 MG tablet Take 1 tablet (25 mg total) by mouth 3 (three) times daily as needed for dizziness.  . metFORMIN (GLUCOPHAGE) 1000 MG tablet Take 0.5 tablets (500 mg total) by mouth 2 (two) times daily with a meal. (Patient taking differently: Take 1,000 mg by mouth daily with breakfast. )  . niacin 100 MG tablet Take 100 mg by mouth at bedtime.  Marland Kitchen POTASSIUM  GLUCONATE PO Take by mouth daily.  . promethazine (PHENERGAN) 12.5 MG tablet Take 1 tablet (12.5 mg total) by mouth every 6 (six) hours as needed for nausea or vomiting. FOR NAUSEA.  Marland Kitchen simvastatin (ZOCOR) 20 MG tablet TAKE ONE TABLET BY MOUTH AT BEDTIME -  APPOINTMENT  NEEDED  FOR  FUTURE  REFILLS  . traMADol (ULTRAM) 50 MG tablet Take 50 mg by mouth 3 (three) times daily.    . vitamin B-12 (CYANOCOBALAMIN) 1000 MCG tablet Take 1,000 mcg by mouth daily.  Marland Kitchen zinc gluconate 50 MG tablet Take 50 mg by mouth daily.     Allergies:   Novocain [procaine]   ROS:   No palpitations or syncope.  Prior CV studies:     The following studies were reviewed today:  No interval cardiac testing for review today.  Labs/Other Tests and Data Reviewed:    EKG:  An ECG dated 06/07/2019 was personally reviewed today and demonstrated:  Normal sinus rhythm.  Recent Labs:  September 2020: AST 34, ALT 36, cholesterol 124, HDL 41, triglycerides 139, LDL 60, hemoglobin A1c 6.6%, potassium 3.8, BUN 21, creatinine 0.9, hemoglobin 13.3  Wt Readings from Last 3 Encounters:  05/08/20 162 lb (73.5 kg)  06/07/19 180 lb (81.6 kg)  02/16/18 155 lb (70.3 kg)     Objective:    Vital Signs:  Ht 5\' 2"  (1.575 m)   Wt 162 lb (73.5 kg)   BMI 29.63 kg/m   Patient spoke in full sentences, not short of breath.  ASSESSMENT & PLAN:    1.  Essential hypertension by history, did not have measurements for review today, but she states that this has been well controlled and followed by her PCP.  She continues on lisinopril and Tenoretic.  2.  Mixed hyperlipidemia, on Zocor.  Last LDL was sixty.  Requesting interval lab work from PCP.  Time:   Today, I have spent 5 minutes with the patient with telehealth technology discussing the above problems.     Medication Adjustments/Labs and Tests Ordered: Current medicines are reviewed at length with the patient today.  Concerns regarding medicines are outlined above.   Tests Ordered: No orders of the defined types were placed in this encounter.   Medication Changes: No orders of the defined types were placed in this encounter.   Follow Up:  In Person 1 year in the [place] office.  Signed, [place], MD  05/08/2020 10:51 AM    [facility]

## 2020-05-08 NOTE — Patient Instructions (Addendum)

## 2021-04-30 ENCOUNTER — Emergency Department (HOSPITAL_COMMUNITY)
Admission: EM | Admit: 2021-04-30 | Discharge: 2021-05-01 | Disposition: A | Discharge status: Home / Self Care | Payer: Medicare PPO | Attending: Emergency Medicine | Admitting: Emergency Medicine

## 2021-04-30 ENCOUNTER — Other Ambulatory Visit: Payer: Self-pay

## 2021-04-30 ENCOUNTER — Encounter (HOSPITAL_COMMUNITY): Payer: Self-pay | Admitting: Emergency Medicine

## 2021-04-30 DIAGNOSIS — I10 Essential (primary) hypertension: Secondary | ICD-10-CM | POA: Insufficient documentation

## 2021-04-30 DIAGNOSIS — E119 Type 2 diabetes mellitus without complications: Secondary | ICD-10-CM | POA: Diagnosis not present

## 2021-04-30 DIAGNOSIS — Z79899 Other long term (current) drug therapy: Secondary | ICD-10-CM | POA: Insufficient documentation

## 2021-04-30 DIAGNOSIS — R109 Unspecified abdominal pain: Secondary | ICD-10-CM | POA: Insufficient documentation

## 2021-04-30 DIAGNOSIS — Z7982 Long term (current) use of aspirin: Secondary | ICD-10-CM | POA: Diagnosis not present

## 2021-04-30 DIAGNOSIS — M545 Low back pain, unspecified: Secondary | ICD-10-CM | POA: Diagnosis not present

## 2021-04-30 DIAGNOSIS — Z7984 Long term (current) use of oral hypoglycemic drugs: Secondary | ICD-10-CM | POA: Insufficient documentation

## 2021-04-30 DIAGNOSIS — R319 Hematuria, unspecified: Secondary | ICD-10-CM | POA: Diagnosis not present

## 2021-04-30 DIAGNOSIS — Z87891 Personal history of nicotine dependence: Secondary | ICD-10-CM | POA: Insufficient documentation

## 2021-04-30 NOTE — ED Triage Notes (Signed)
Pt c/o left flank pain since seeing pcp on 6/14. Pt is on cefdinir twice daily for blood in urine.

## 2021-05-01 ENCOUNTER — Emergency Department (HOSPITAL_COMMUNITY): Payer: Medicare PPO

## 2021-05-01 LAB — URINALYSIS, ROUTINE W REFLEX MICROSCOPIC
Bilirubin Urine: NEGATIVE
Glucose, UA: 150 mg/dL — AB
Hgb urine dipstick: NEGATIVE
Ketones, ur: 5 mg/dL — AB
Nitrite: NEGATIVE
Protein, ur: NEGATIVE mg/dL
Specific Gravity, Urine: 1.024 (ref 1.005–1.030)
pH: 5 (ref 5.0–8.0)

## 2021-05-01 MED ORDER — MELOXICAM 7.5 MG PO TABS: 7.5000 mg | ORAL_TABLET | Freq: Every day | ORAL | 0 refills | Status: DC | PRN

## 2021-05-01 NOTE — ED Provider Notes (Signed)
[facility] EMERGENCY DEPARTMENT Provider Note   CSN: [id number] Arrival date & time: 04/30/21  2339     History Chief Complaint  Patient presents with   Flank Pain    Megan Wyatt is a 70 y.o. female.  Patient presents to the emergency department for evaluation of left flank pain.  Patient saw her primary care doctor on June 14 for her 71-month follow-up.  She had labs performed at that time.  Blood work was okay but she had blood in her urine.  PCP put her on cefdinir.  She has not seen any blood but now is experiencing pain in the left lower back that radiates to the left groin.      Past Medical History:  Diagnosis Date   Essential hypertension    Hyperlipidemia    Type 2 diabetes mellitus [facility])     Patient Active Problem List   Diagnosis Date Noted   Hypertension    Family history of heart disease    Dyslipidemia    Diabetes mellitus without complication (HCC) 09/09/2015   Essential hypertension, benign 09/09/2015   Hyperlipidemia 09/09/2015   History of nuclear stress test 11/09/2006    Past Surgical History:  Procedure Laterality Date   ABDOMINAL HYSTERECTOMY  2001   COLONOSCOPY       OB History   No obstetric history on file.     Family History  Problem Relation Age of Onset   Heart disease Mother        CABG   Stroke Mother    Cancer Brother     Social History   Tobacco Use   Smoking status: Former    Pack years: 0.00    Types: Cigarettes   Smokeless tobacco: Never  Vaping Use   Vaping Use: Never used  Substance Use Topics   Alcohol use: No   Drug use: No    Home Medications Prior to Admission medications   Medication Sig Start Date End Date Taking? Authorizing Provider  meloxicam (MOBIC) 7.5 MG tablet Take 1 tablet (7.5 mg total) by mouth daily as needed for pain. 05/01/21  Yes Yuriel Lopezmartinez, Canary Brim, MD  ascorbic acid (VITAMIN C) 500 MG tablet Take 500 mg by mouth daily.    [provider]  aspirin EC 81 MG tablet Take 81  mg by mouth daily.    [provider]  atenolol-chlorthalidone (TENORETIC) 50-25 MG per tablet Take 1 tablet by mouth daily.      [provider]  b complex vitamins tablet Take 1 tablet by mouth daily.    [provider]  Cholecalciferol (VITAMIN D PO) Take by mouth daily.    [provider]  Cholecalciferol (VITAMIN D3) 125 MCG (5000 UT) CAPS Take 1 capsule by mouth daily.    [provider]  cyclobenzaprine (FLEXERIL) 5 MG tablet Take 5 mg by mouth 2 (two) times daily as needed. 12/01/17   [provider]  diclofenac (VOLTAREN) 75 MG EC tablet Take 75 mg by mouth 2 (two) times daily as needed.  02/01/18   [provider]  Garlic 1000 MG CAPS Take 1 capsule by mouth daily.     [provider]  lisinopril (PRINIVIL,ZESTRIL) 10 MG tablet Take 10 mg by mouth daily.      [provider]  magnesium gluconate (MAGONATE) 500 MG tablet Take 500 mg by mouth at bedtime as needed.    [provider]  meclizine (ANTIVERT) 25 MG tablet Take 1 tablet (25  mg total) by mouth 3 (three) times daily as needed for dizziness. 06/06/17   Ivery Quale, PA-C  metFORMIN (GLUCOPHAGE) 1000 MG tablet Take 0.5 tablets (500 mg total) by mouth 2 (two) times daily with a meal. Patient taking differently: Take 1,000 mg by mouth daily with breakfast.  09/09/15   Roma Kayser, MD  niacin 100 MG tablet Take 100 mg by mouth at bedtime.    [provider]  POTASSIUM GLUCONATE PO Take by mouth daily.    [provider]  promethazine (PHENERGAN) 12.5 MG tablet Take 1 tablet (12.5 mg total) by mouth every 6 (six) hours as needed for nausea or vomiting. FOR NAUSEA. 06/06/17   Ivery Quale, PA-C  simvastatin (ZOCOR) 20 MG tablet TAKE ONE TABLET BY MOUTH AT BEDTIME -  APPOINTMENT  NEEDED  FOR  FUTURE  REFILLS 10/03/13   Chrystie Nose, MD  traMADol (ULTRAM) 50 MG tablet Take 50 mg by mouth 3 (three) times daily.       [provider]  vitamin B-12 (CYANOCOBALAMIN) 1000 MCG tablet Take 1,000 mcg by mouth daily.    [provider]  zinc gluconate 50 MG tablet Take 50 mg by mouth daily.    [provider]    Allergies    Novocain [procaine]  Review of Systems   Review of Systems  Genitourinary:  Positive for flank pain.  All other systems reviewed and are negative.  Physical Exam Updated Vital Signs BP 101/61 (BP Location: Right Arm)   Pulse 70   Temp 98.4 F (36.9 C) (Oral)   Resp 20   Ht 5\' 2"  (1.575 m)   Wt 74 kg   SpO2 92%   BMI 29.84 kg/m   Physical Exam Vitals and nursing note reviewed.  Constitutional:      General: She is not in acute distress.    Appearance: Normal appearance. She is well-developed.  HENT:     Head: Normocephalic and atraumatic.     Right Ear: Hearing normal.     Left Ear: Hearing normal.     Nose: Nose normal.  Eyes:     Conjunctiva/sclera: Conjunctivae normal.     Pupils: Pupils are equal, round, and reactive to light.  Cardiovascular:     Rate and Rhythm: Regular rhythm.     Heart sounds: S1 normal and S2 normal. No murmur heard.   No friction rub. No gallop.  Pulmonary:     Effort: Pulmonary effort is normal. No respiratory distress.     Breath sounds: Normal breath sounds.  Chest:     Chest wall: No tenderness.  Abdominal:     General: Bowel sounds are normal.     Palpations: Abdomen is soft.     Tenderness: There is no abdominal tenderness. There is no guarding or rebound. Negative signs include Murphy's sign and McBurney's sign.     Hernia: No hernia is present.  Musculoskeletal:        General: Normal range of motion.     Cervical back: Normal range of motion and neck supple.  Skin:    General: Skin is warm and dry.     Findings: No rash.  Neurological:     Mental Status: She is alert and oriented to person, place, and time.     GCS: GCS eye subscore is 4. GCS verbal subscore is 5. GCS motor subscore is 6.      Cranial Nerves: No cranial nerve deficit.     Sensory: No  sensory deficit.     Coordination: Coordination normal.  Psychiatric:        Speech: Speech normal.        Behavior: Behavior normal.        Thought Content: Thought content normal.    ED Results / Procedures / Treatments   Labs (all labs ordered are listed, but only abnormal results are displayed) Labs Reviewed  URINALYSIS, ROUTINE W REFLEX MICROSCOPIC - Abnormal; Notable for the following components:      Result Value   APPearance HAZY (*)    Glucose, UA 150 (*)    Ketones, ur 5 (*)    Leukocytes,Ua TRACE (*)    Bacteria, UA RARE (*)    All other components within normal limits    EKG None  Radiology CT RENAL STONE STUDY  Result Date: 05/01/2021 CLINICAL DATA:  Left flank pain EXAM: CT ABDOMEN AND PELVIS WITHOUT CONTRAST TECHNIQUE: Multidetector CT imaging of the abdomen and pelvis was performed following the standard protocol without IV contrast. COMPARISON:  None. FINDINGS: Lower chest: The visualized lung bases are clear. Mild coronary artery calcification. Cardiac size within normal limits. Hepatobiliary: No focal liver abnormality is seen. No gallstones, gallbladder wall thickening, or biliary dilatation. Pancreas: Unremarkable Spleen: Unremarkable Adrenals/Urinary Tract: Adrenal glands are unremarkable. Kidneys are normal, without renal calculi, focal lesion, or hydronephrosis. Bladder is unremarkable. Stomach/Bowel: Stomach is within normal limits. Appendix appears normal. No evidence of bowel wall thickening, distention, or inflammatory changes. No free intraperitoneal gas or fluid. Vascular/Lymphatic: There is moderate aortoiliac atherosclerotic calcification. No aortic aneurysm. No pathologic adenopathy within the abdomen and pelvis. Reproductive: Status post hysterectomy. No adnexal masses. Other: No abdominal wall hernia.  The rectum is unremarkable. Musculoskeletal: Changes of advanced degenerative disc disease are  noted throughout the lumbar spine. No acute bone abnormality. No lytic or blastic bone lesions are identified. IMPRESSION: No acute intra-abdominal pathology identified. No nephro or urolithiasis. No definite radiographic explanation for the patient's reported symptoms. Electronically Signed   By: Helyn Numbers MD   On: 05/01/2021 02:42    Procedures Procedures   Medications Ordered in ED Medications - No data to display  ED Course  I have reviewed the triage vital signs and the nursing notes.  Pertinent labs & imaging results that were available during my care of the patient were reviewed by me and considered in my medical decision making (see chart for details).    MDM Rules/Calculators/A&P                          Resents to the emergency department for evaluation of left-sided flank pain.  Patient was seen by her primary doctor and started on cefdinir for possible UTI secondary to microscopic hematuria on urinalysis.  Urinalysis today does not suggest persistent infection.  CT scan does not show any acute pathology including no signs of ureterolithiasis.  Etiology unclear, likely musculoskeletal in nature.  Final Clinical Impression(s) / ED Diagnoses Final diagnoses:  Left flank pain    Rx / DC Orders ED Discharge Orders          Ordered    meloxicam (MOBIC) 7.5 MG tablet  Daily PRN        05/01/21 0313             Gilda Crease, MD 05/01/21 [phone]

## 2021-05-01 NOTE — ED Notes (Signed)
ED Provider at bedside. Patient woke long enough to put blood pressure cuff on arm. Patient snoring at this time, Vital within normal limits

## 2022-04-22 IMAGING — CT CT RENAL STONE PROTOCOL
2 of 4 series · 17 of 46 positions shown, 19 images · non-contrast
Comparison: None.

CLINICAL DATA: Left flank pain

EXAM:
CT ABDOMEN AND PELVIS WITHOUT CONTRAST
TECHNIQUE: Multidetector CT imaging of the abdomen and pelvis was performed
following the standard protocol without IV contrast.

[Series 2: axial st · axial · 0.79mm/px · z∈[-830,-445]mm · 14 of 89 slices shown, 16 images]
[im 6/89  soft-tissue]
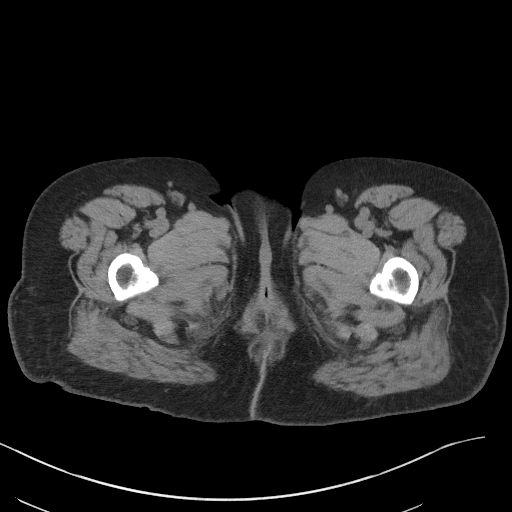
[im 6/89  bone]
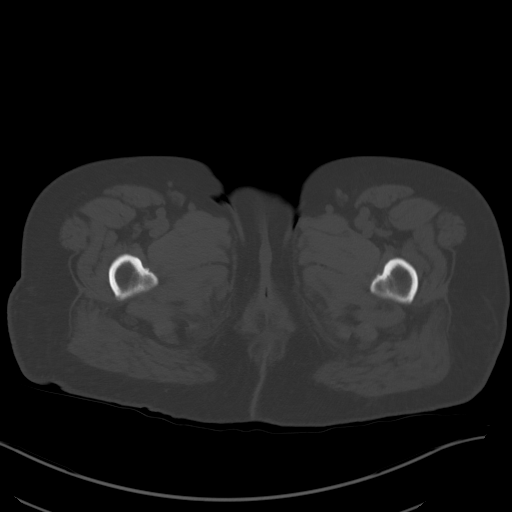
[im 11/89  soft-tissue]
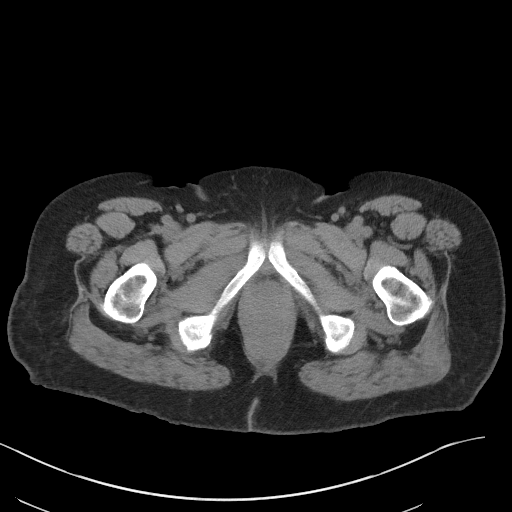
[im 16/89  soft-tissue]
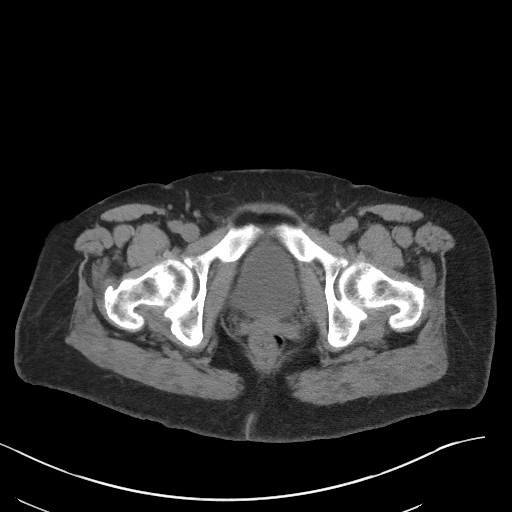
[im 26/89  soft-tissue]
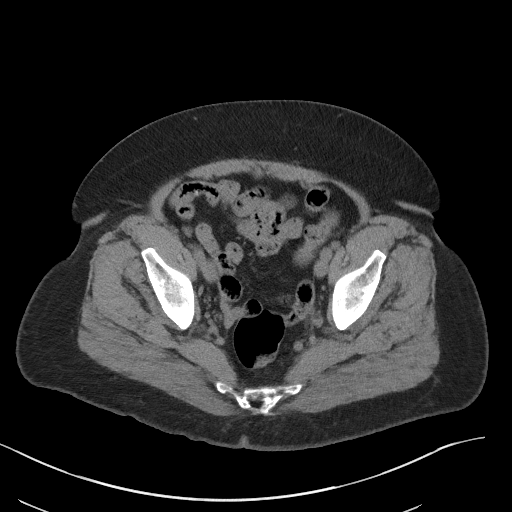
[im 32/89  soft-tissue]
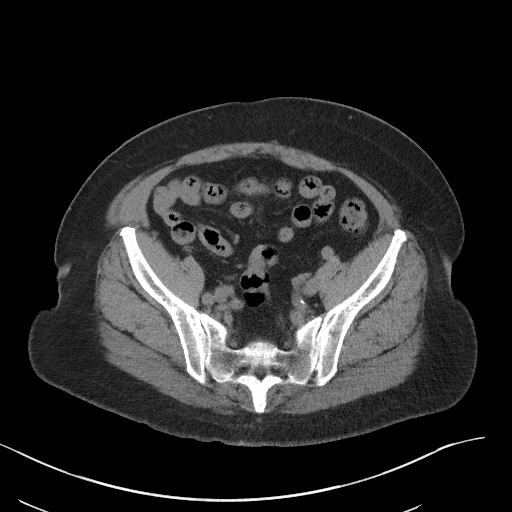
[im 37/89  soft-tissue]
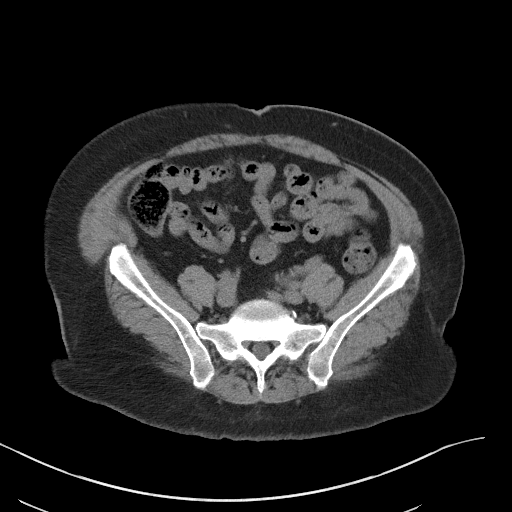
[im 42/89  soft-tissue]
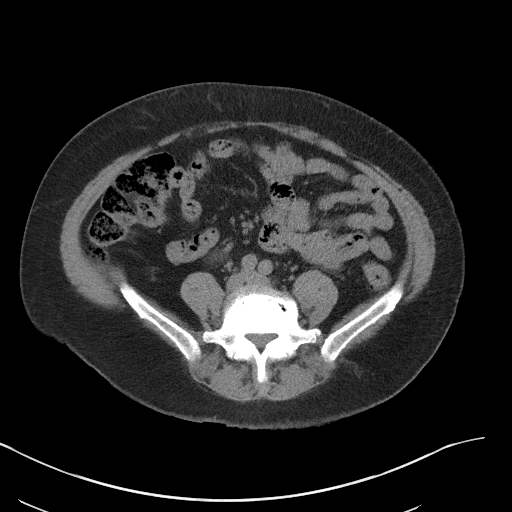
[im 47/89  soft-tissue]
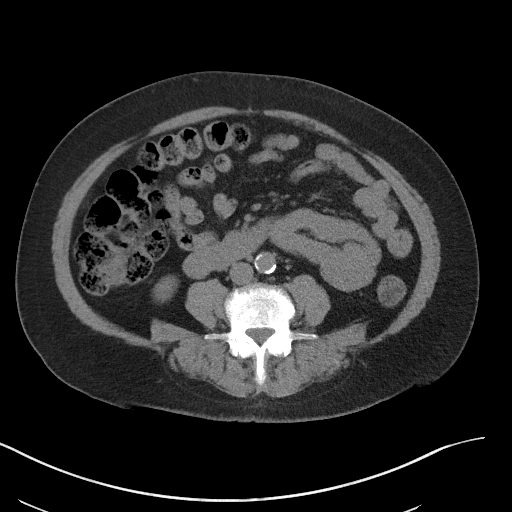
[im 52/89  soft-tissue]
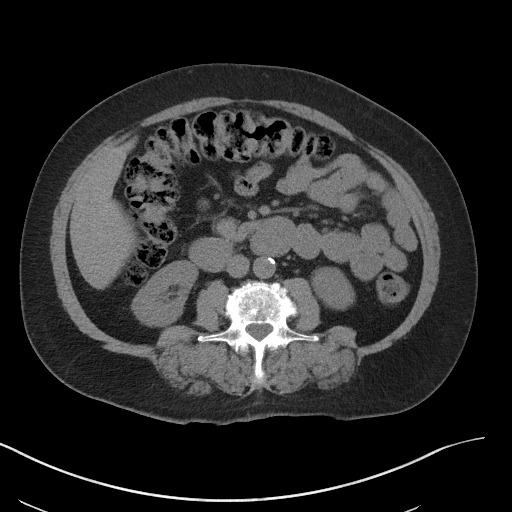
[im 52/89  bone]
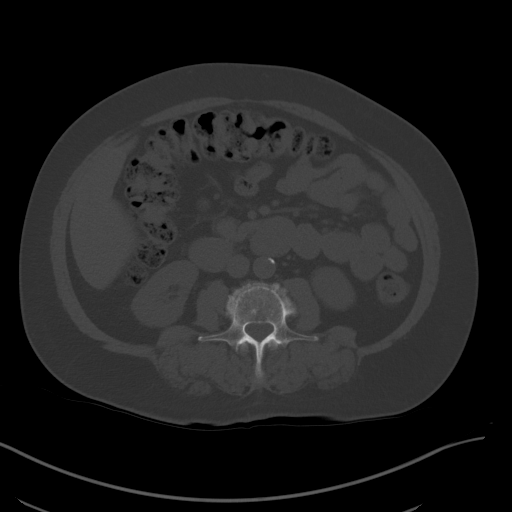
[im 57/89  soft-tissue]
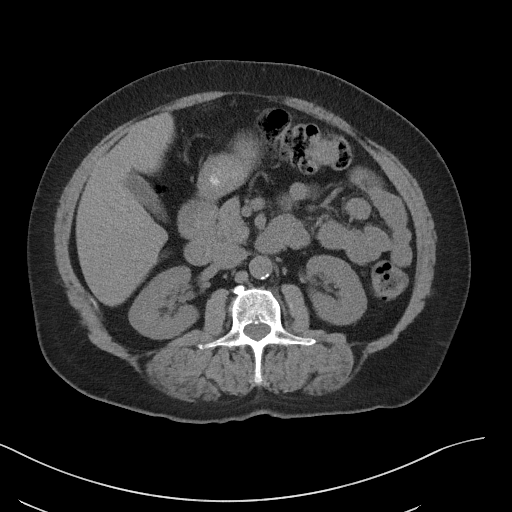
[im 68/89  soft-tissue]
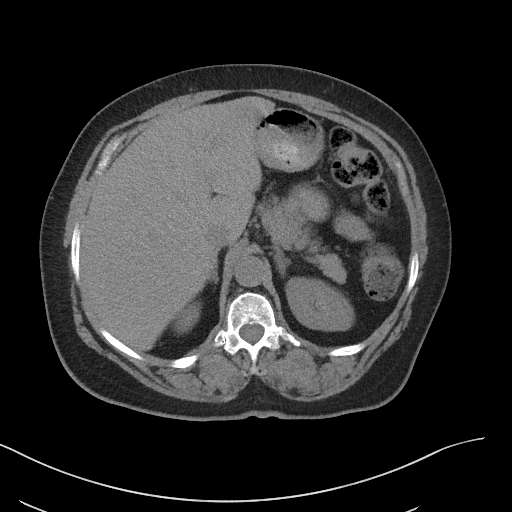
[im 73/89  soft-tissue]
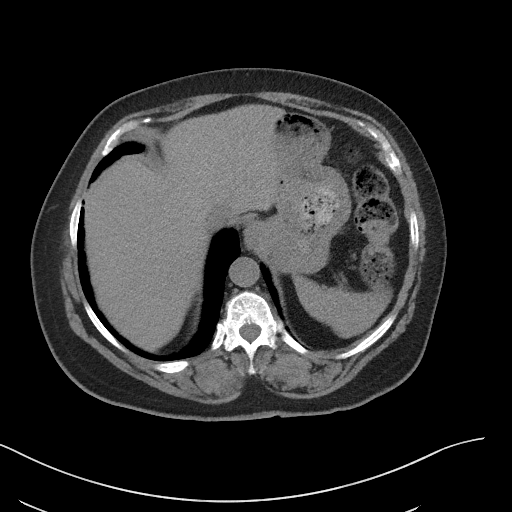
[im 78/89  soft-tissue]
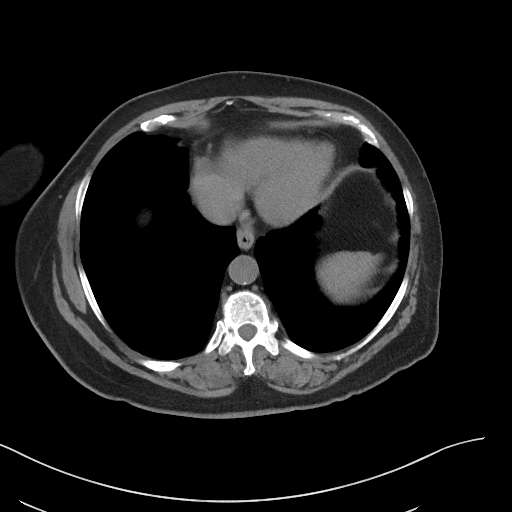
[im 83/89  soft-tissue]
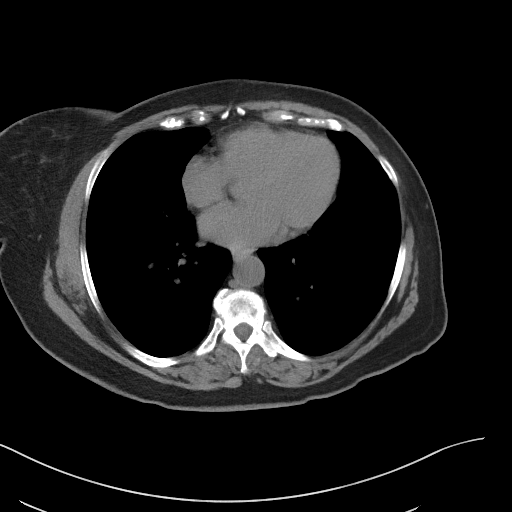

[Series 5: coronal st · coronal · 0.77mm/px · 3 of 101 slices shown]
[im 34/101  soft-tissue]
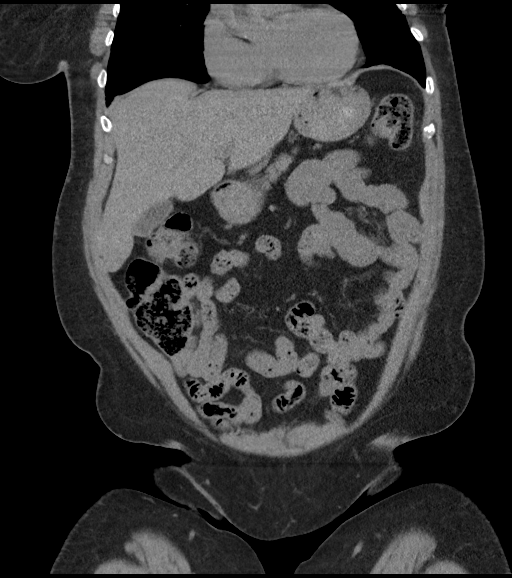
[im 45/101  soft-tissue]
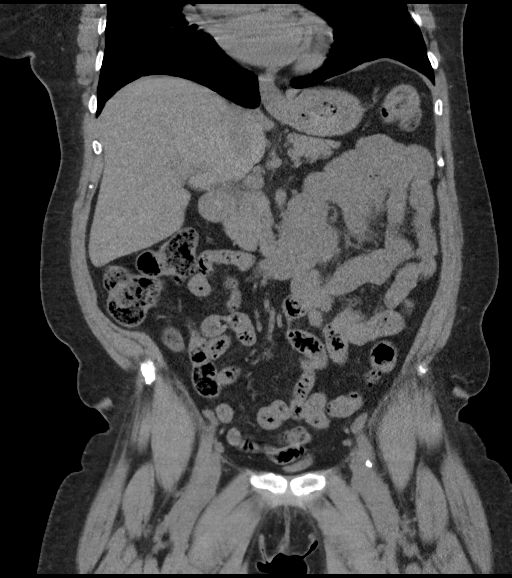
[im 56/101  soft-tissue]
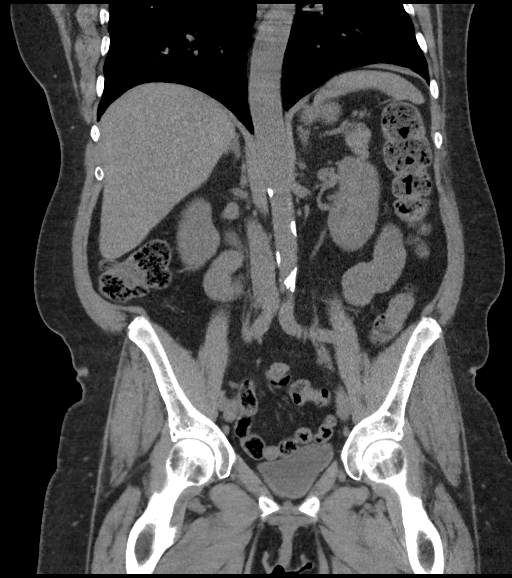

[17 of 46 positions shown; findings below may reference images not displayed]

FINDINGS: Lower chest: The visualized lung bases are clear. Mild coronary
artery calcification. Cardiac size within normal limits.

Hepatobiliary: No focal liver abnormality is seen. No gallstones,
gallbladder wall thickening, or biliary dilatation.

Pancreas: Unremarkable

Spleen: Unremarkable

Adrenals/Urinary Tract: Adrenal glands are unremarkable. Kidneys are
normal, without renal calculi, focal lesion, or hydronephrosis.
Bladder is unremarkable.

Stomach/Bowel: Stomach is within normal limits. Appendix appears
normal. No evidence of bowel wall thickening, distention, or
inflammatory changes. No free intraperitoneal gas or fluid.

Vascular/Lymphatic: There is moderate aortoiliac atherosclerotic
calcification. No aortic aneurysm. No pathologic adenopathy within
the abdomen and pelvis.

Reproductive: Status post hysterectomy. No adnexal masses.

Other: No abdominal wall hernia.  The rectum is unremarkable.

Musculoskeletal: Changes of advanced degenerative disc disease are
noted throughout the lumbar spine. No acute bone abnormality. No
lytic or blastic bone lesions are identified.
IMPRESSION: No acute intra-abdominal pathology identified. No nephro or
urolithiasis. No definite radiographic explanation for the patient's
reported symptoms.

## 2023-02-25 ENCOUNTER — Encounter (HOSPITAL_COMMUNITY): Payer: Self-pay

## 2023-02-25 ENCOUNTER — Other Ambulatory Visit: Payer: Self-pay

## 2023-02-25 ENCOUNTER — Emergency Department (HOSPITAL_COMMUNITY)
Admission: EM | Admit: 2023-02-25 | Discharge: 2023-02-25 | Disposition: A | Discharge status: Home / Self Care | Payer: Medicare (Managed Care) | Attending: Emergency Medicine | Admitting: Emergency Medicine

## 2023-02-25 ENCOUNTER — Emergency Department (HOSPITAL_COMMUNITY): Payer: Medicare (Managed Care)

## 2023-02-25 DIAGNOSIS — Z7982 Long term (current) use of aspirin: Secondary | ICD-10-CM | POA: Diagnosis not present

## 2023-02-25 DIAGNOSIS — M79605 Pain in left leg: Secondary | ICD-10-CM

## 2023-02-25 MED ORDER — MELOXICAM 7.5 MG PO TABS: 7.5000 mg | ORAL_TABLET | Freq: Every day | ORAL | 0 refills | Status: AC

## 2023-02-25 NOTE — Discharge Instructions (Signed)
Please read and follow all provided instructions.  Your diagnoses today include:  1. Left leg pain    Tests performed today include: Ultrasound of your lower extremity: does not show any signs of a blood clot Vital signs. See below for your results today.   Medications prescribed:  None Take any prescribed medications only as directed.  Home care instructions:  Follow any educational materials contained in this packet Follow R.I.C.E. Protocol: R - rest your injury  I  - use ice on injury without applying directly to skin C - compress injury with bandage or splint E - elevate the injury as much as possible  Follow-up instructions: Please follow-up with the provided orthopedic physician (bone specialist) in 1 week.   Return instructions:  Please return if your toes or feet are numb or tingling, appear gray or blue, or you have severe pain (also elevate the leg and loosen splint or wrap if you were given one) Please return to the Emergency Department if you experience worsening symptoms.  Please return if you have any other emergent concerns.  Additional Information:  Your vital signs today were: BP (!) 140/72 (BP Location: Right Arm)   Pulse 81   Temp 98.2 F (36.8 C) (Oral)   Resp 16   Ht  (1.575 m)   Wt 78.5 kg   SpO2 98%   BMI 31.64 kg/m  If your blood pressure (BP) was elevated above 135/85 this visit, please have this repeated by your doctor within one month. --------------

## 2023-02-25 NOTE — ED Triage Notes (Signed)
Patient from home for L upper leg pain that travels across her back; states she thinks she has a DVT. Reports she originally thought it was sciatica but has had therapy for hamstring tendonitis. Upon arrival to ER, patient is alert and oriented, ambu

## 2023-02-25 NOTE — ED Provider Notes (Signed)
[place] EMERGENCY DEPARTMENT AT [facility] Provider Note   CSN: [id number] Arrival date & time: 02/25/23  1332     History  Chief Complaint  Patient presents with   Leg Pain    Megan Wyatt is a 72 y.o. female.  Patient presents the emergency department for evaluation of left upper leg pain that has been ongoing for 3-1/2 months.  She has a history of sciatica but states that this pain is different.  She feels a knot in the back of her hamstring.  She has been seen by her primary care who referred her to physical therapy which she has performed.  She has not gotten much relief from this.  She has tried Flexeril without improvement.  She was concerned about a deep venous thrombosis.  Denies swelling in the lower leg or upper leg.  Pain is worse with different positions and movements.  No distal numbness or tingling.  Patient is ambulatory at baseline.       Home Medications Prior to Admission medications   Medication Sig Start Date End Date Taking? Authorizing Provider  ascorbic acid (VITAMIN C) 500 MG tablet Take 500 mg by mouth daily.    [provider]  aspirin EC 81 MG tablet Take 81 mg by mouth daily.    [provider]  atenolol-chlorthalidone (TENORETIC) 50-25 MG per tablet Take 1 tablet by mouth daily.      [provider]  b complex vitamins tablet Take 1 tablet by mouth daily.    [provider]  Cholecalciferol (VITAMIN D PO) Take by mouth daily.    [provider]  Cholecalciferol (VITAMIN D3) 125 MCG (5000 UT) CAPS Take 1 capsule by mouth daily.    [provider]  cyclobenzaprine (FLEXERIL) 5 MG tablet Take 5 mg by mouth 2 (two) times daily as needed. 12/01/17   [provider]  diclofenac (VOLTAREN) 75 MG EC tablet Take 75 mg by mouth 2 (two) times daily as needed.  02/01/18   [provider]  Garlic 1000 MG CAPS Take 1 capsule by mouth daily.     [provider]  lisinopril  (PRINIVIL,ZESTRIL) 10 MG tablet Take 10 mg by mouth daily.      [provider]  magnesium gluconate (MAGONATE) 500 MG tablet Take 500 mg by mouth at bedtime as needed.    [provider]  meclizine (ANTIVERT) 25 MG tablet Take 1 tablet (25 mg total) by mouth 3 (three) times daily as needed for dizziness. 06/06/17   Ivery Quale, PA-C  meloxicam (MOBIC) 7.5 MG tablet Take 1 tablet (7.5 mg total) by mouth daily as needed for pain. 05/01/21   Gilda Crease, MD  metFORMIN (GLUCOPHAGE) 1000 MG tablet Take 0.5 tablets (500 mg total) by mouth 2 (two) times daily with a meal. Patient taking differently: Take 1,000 mg by mouth daily with breakfast.  09/09/15   Roma Kayser, MD  niacin 100 MG tablet Take 100 mg by mouth at bedtime.    [provider]  POTASSIUM GLUCONATE PO Take by mouth daily.    [provider]  promethazine (PHENERGAN) 12.5 MG tablet Take 1 tablet (12.5 mg total) by mouth every 6 (six) hours as needed for nausea or vomiting. FOR NAUSEA. 06/06/17   Ivery Quale, PA-C  simvastatin (ZOCOR) 20 MG tablet TAKE ONE TABLET BY MOUTH AT BEDTIME -  APPOINTMENT  NEEDED  FOR  FUTURE  REFILLS 10/03/13   Hilty, Lisette Abu,  MD  traMADol (ULTRAM) 50 MG tablet Take 50 mg by mouth 3 (three) times daily.      [provider]  vitamin B-12 (CYANOCOBALAMIN) 1000 MCG tablet Take 1,000 mcg by mouth daily.    [provider]  zinc gluconate 50 MG tablet Take 50 mg by mouth daily.    [provider]      Allergies    Novocain [procaine]    Review of Systems   Review of Systems  Physical Exam Updated Vital Signs BP (!) 140/72 (BP Location: Right Arm)   Pulse 81   Temp 98.2 F (36.8 C) (Oral)   Resp 16   Ht  (1.575 m)   Wt 78.5 kg   SpO2 98%   BMI 31.64 kg/m  Physical Exam Vitals and nursing note reviewed.  Constitutional:      Appearance: She is well-developed.  HENT:     Head: Normocephalic and atraumatic.   Eyes:     Pupils: Pupils are equal, round, and reactive to light.  Cardiovascular:     Pulses: Normal pulses. No decreased pulses.  Musculoskeletal:        General: Tenderness present.     Cervical back: Normal range of motion and neck supple.     Left hip: No tenderness. Normal range of motion.     Left upper leg: Tenderness present. No edema.     Left knee: No swelling. Normal range of motion. No tenderness.     Left lower leg: No swelling. No edema.     Left ankle: No tenderness. Normal range of motion.  Skin:    General: Skin is warm and dry.  Neurological:     Mental Status: She is alert.     Sensory: No sensory deficit.     Comments: Motor, sensation, and vascular distal to the injury is fully intact.   Psychiatric:        Mood and Affect: Mood normal.     ED Results / Procedures / Treatments   Labs (all labs ordered are listed, but only abnormal results are displayed) Labs Reviewed - No data to display  EKG None  Radiology US Venous Img Lower  Left (DVT Study)  Result Date: 02/25/2023 CLINICAL DATA:  L posterior thigh swelling, L posterior thigh pain EXAM: LEFT LOWER EXTREMITY VENOUS DOPPLER ULTRASOUND TECHNIQUE: Gray-scale sonography with graded compression, as well as color Doppler and duplex ultrasound were performed to evaluate the lower extremity deep venous systems from the level of the common femoral vein and including the common femoral, femoral, profunda femoral, popliteal and calf veins including the posterior tibial, peroneal and gastrocnemius veins when visible. The superficial great saphenous vein was also interrogated. Spectral Doppler was utilized to evaluate flow at rest and with distal augmentation maneuvers in the common femoral, femoral and popliteal veins. COMPARISON:  None Available. FINDINGS: Contralateral Common Femoral Vein: Respiratory phasicity is normal and symmetric with the symptomatic side. No evidence of thrombus. Normal compressibility.  Common Femoral Vein: No evidence of thrombus. Normal compressibility, respiratory phasicity and response to augmentation. Saphenofemoral Junction: No evidence of thrombus. Normal compressibility and flow on color Doppler imaging. Profunda Femoral Vein: No evidence of thrombus. Normal compressibility and flow on color Doppler imaging. Femoral Vein: No evidence of thrombus. Normal compressibility, respiratory phasicity and response to augmentation. Popliteal Vein: No evidence of thrombus. Normal compressibility, respiratory phasicity and response to augmentation. Calf Veins: No evidence of thrombus. Normal compressibility and flow on color Doppler imaging. Superficial Great  Saphenous Vein: No evidence of thrombus. Normal compressibility. Venous Reflux:  None. Other Findings:  None. IMPRESSION: No evidence of deep venous thrombosis in the left lower extremity. Electronically Signed   By: Caprice Renshaw M.D.   On: 02/25/2023 15:26    Procedures Procedures    Medications Ordered in ED Medications - No data to display  ED Course/ Medical Decision Making/ A&P    Patient seen and examined. History obtained directly from patient.   Labs/EKG: None ordered  Imaging: DVT left lower extremity  Medications/Fluids: None ordered  Most recent vital signs reviewed and are as follows: BP (!) 140/72 (BP Location: Right Arm)   Pulse 81   Temp 98.2 F (36.8 C) (Oral)   Resp 16   Ht  (1.575 m)   Wt 78.5 kg   SpO2 98%   BMI 31.64 kg/m   Initial impression: Chronic left lower extremity pain, likely musculoskeletal, here to rule out DVT today.  3:46 PM Reassessment performed. Patient appears stable.  She is resting in the chair.  Imaging results personally reviewed: Negative for DVT  Reviewed pertinent lab work and imaging with patient at bedside. Questions answered.   Most current vital signs reviewed and are as follows: BP (!) 140/72 (BP Location: Right Arm)   Pulse 81   Temp 98.2 F (36.8 C)  (Oral)   Resp 16   Ht  (1.575 m)   Wt 78.5 kg   SpO2 98%   BMI 31.64 kg/m   Plan: Discharge to home.   Prescriptions written for: None  Other home care instructions discussed: Continue home meds, elevation, routine care  ED return instructions discussed: Return with numbness or tingling, inability to walk, new or worsening symptoms  Follow-up instructions discussed: Patient encouraged to follow-up with orthopedic referral in 1 week.                           Medical Decision Making  Patient with hamstring pain that seems very musculoskeletal in nature.  History of sciatica however symptoms are not typical for that.  DVT negative today.  Patient has had physical therapy which was a limited benefit.  She would likely benefit from orthopedic follow-up.  Patient has normal pulses and no signs of ischemia today.         Final Clinical Impression(s) / ED Diagnoses Final diagnoses:  Left leg pain    Rx / DC Orders ED Discharge Orders     None         Renne Crigler, PA-C 02/25/23 1548    Derwood Kaplan, MD 02/27/23 1553

## 2023-03-02 ENCOUNTER — Other Ambulatory Visit (HOSPITAL_COMMUNITY): Payer: Self-pay | Admitting: Family Medicine

## 2023-03-02 DIAGNOSIS — Z1231 Encounter for screening mammogram for malignant neoplasm of breast: Secondary | ICD-10-CM

## 2023-03-05 ENCOUNTER — Other Ambulatory Visit (INDEPENDENT_AMBULATORY_CARE_PROVIDER_SITE_OTHER): Payer: Medicare (Managed Care)

## 2023-03-05 ENCOUNTER — Ambulatory Visit (INDEPENDENT_AMBULATORY_CARE_PROVIDER_SITE_OTHER): Payer: Medicare (Managed Care) | Admitting: Orthopedic Surgery

## 2023-03-05 ENCOUNTER — Encounter: Payer: Self-pay | Admitting: Orthopedic Surgery

## 2023-03-05 VITALS — BP 134/72 | HR 70 | Ht 62.0 in | Wt 172.0 lb

## 2023-03-05 DIAGNOSIS — M79605 Pain in left leg: Secondary | ICD-10-CM | POA: Diagnosis not present

## 2023-03-05 DIAGNOSIS — M5410 Radiculopathy, site unspecified: Secondary | ICD-10-CM
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
## 2023-03-05 NOTE — Patient Instructions (Signed)
While we are working on your approval for MRI please go ahead and call to schedule your appointment with [facility] within at least one (1) week.   Central Scheduling [phone]  

## 2023-03-08 ENCOUNTER — Ambulatory Visit (HOSPITAL_COMMUNITY): Payer: Medicare (Managed Care)

## 2023-04-02 ENCOUNTER — Ambulatory Visit (HOSPITAL_COMMUNITY)
Admission: RE | Admit: 2023-04-02 | Discharge: 2023-04-02 | Disposition: A | Discharge status: Home / Self Care | Payer: Medicare (Managed Care) | Source: Ambulatory Visit | Attending: Family Medicine | Admitting: Family Medicine

## 2023-04-02 ENCOUNTER — Ambulatory Visit (HOSPITAL_COMMUNITY)
Admission: RE | Admit: 2023-04-02 | Discharge: 2023-04-02 | Disposition: A | Discharge status: Home / Self Care | Payer: Medicare (Managed Care) | Source: Ambulatory Visit | Attending: Orthopedic Surgery | Admitting: Orthopedic Surgery

## 2023-04-02 DIAGNOSIS — Z1231 Encounter for screening mammogram for malignant neoplasm of breast: Secondary | ICD-10-CM

## 2023-04-02 DIAGNOSIS — M5410 Radiculopathy, site unspecified: Secondary | ICD-10-CM | POA: Diagnosis present

## 2023-04-07 ENCOUNTER — Inpatient Hospital Stay
Admission: RE | Admit: 2023-04-07 | Discharge: 2023-04-07 | Disposition: A | Discharge status: Home / Self Care | Payer: Self-pay | Source: Ambulatory Visit | Attending: Orthopedic Surgery | Admitting: Orthopedic Surgery

## 2023-04-07 ENCOUNTER — Other Ambulatory Visit: Payer: Self-pay | Admitting: Orthopedic Surgery

## 2023-04-07 DIAGNOSIS — Z1231 Encounter for screening mammogram for malignant neoplasm of breast: Secondary | ICD-10-CM
